# Patient Record
Sex: Male | Born: 1955 | Race: Black or African American | Hispanic: No | Smoking: Never smoker
Health system: Southern US, Community
[De-identification: ages and names within clinical notes are randomized; demographics above are authoritative.]

## PROBLEM LIST (undated history)

## (undated) DIAGNOSIS — M48061 Spinal stenosis, lumbar region without neurogenic claudication: Secondary | ICD-10-CM

## (undated) DIAGNOSIS — E785 Hyperlipidemia, unspecified: Secondary | ICD-10-CM

## (undated) DIAGNOSIS — D649 Anemia, unspecified: Secondary | ICD-10-CM

## (undated) DIAGNOSIS — K219 Gastro-esophageal reflux disease without esophagitis: Secondary | ICD-10-CM

## (undated) DIAGNOSIS — R5383 Other fatigue: Secondary | ICD-10-CM

## (undated) DIAGNOSIS — M5126 Other intervertebral disc displacement, lumbar region: Secondary | ICD-10-CM

## (undated) DIAGNOSIS — C61 Malignant neoplasm of prostate: Secondary | ICD-10-CM

## (undated) DIAGNOSIS — C189 Malignant neoplasm of colon, unspecified: Secondary | ICD-10-CM

## (undated) DIAGNOSIS — Z6841 Body Mass Index (BMI) 40.0 and over, adult: Secondary | ICD-10-CM

## (undated) HISTORY — PX: PROSTATE SURGERY: SHX751

## (undated) HISTORY — DX: Other fatigue: R53.83

## (undated) HISTORY — DX: Spinal stenosis, lumbar region without neurogenic claudication: M48.061

## (undated) HISTORY — DX: Body Mass Index (BMI) 40.0 and over, adult: Z684

## (undated) HISTORY — DX: Morbid (severe) obesity due to excess calories: E66.01

## (undated) HISTORY — DX: Other intervertebral disc displacement, lumbar region: M51.26

## (undated) HISTORY — PX: LAPAROSCOPY: SHX197

## (undated) HISTORY — DX: Malignant neoplasm of colon, unspecified: C18.9

## (undated) HISTORY — PX: COLON SURGERY: SHX602

---

## 1978-01-09 HISTORY — PX: KNEE ARTHROSCOPY: SUR90

## 2007-11-22 ENCOUNTER — Ambulatory Visit: Payer: Self-pay | Admitting: Internal Medicine

## 2007-11-22 DIAGNOSIS — J309 Allergic rhinitis, unspecified: Secondary | ICD-10-CM | POA: Insufficient documentation

## 2007-11-22 DIAGNOSIS — R9431 Abnormal electrocardiogram [ECG] [EKG]: Secondary | ICD-10-CM

## 2007-11-22 DIAGNOSIS — E785 Hyperlipidemia, unspecified: Secondary | ICD-10-CM

## 2007-11-22 DIAGNOSIS — Z8679 Personal history of other diseases of the circulatory system: Secondary | ICD-10-CM | POA: Insufficient documentation

## 2007-11-22 DIAGNOSIS — F329 Major depressive disorder, single episode, unspecified: Secondary | ICD-10-CM

## 2007-11-22 DIAGNOSIS — F3289 Other specified depressive episodes: Secondary | ICD-10-CM | POA: Insufficient documentation

## 2007-11-24 LAB — CONVERTED CEMR LAB
AST: 33 units/L (ref 0–37)
Basophils Absolute: 0 10*3/uL (ref 0.0–0.1)
Basophils Relative: 0.1 % (ref 0.0–3.0)
Chloride: 107 meq/L (ref 96–112)
Cholesterol: 212 mg/dL (ref 0–200)
Creatinine, Ser: 0.7 mg/dL (ref 0.4–1.5)
Direct LDL: 139.7 mg/dL
Eosinophils Absolute: 0.2 10*3/uL (ref 0.0–0.7)
GFR calc Af Amer: 152 mL/min
GFR calc non Af Amer: 126 mL/min
HDL: 44.2 mg/dL (ref >39.0)
Hemoglobin, Urine: NEGATIVE
Ketones, ur: NEGATIVE mg/dL
MCHC: 33.1 g/dL (ref 30.0–36.0)
MCV: 84.9 fL (ref 78.0–100.0)
Monocytes Absolute: 0.9 10*3/uL (ref 0.1–1.0)
Neutrophils Relative %: 54.5 % (ref 43.0–77.0)
PSA: 0.24 ng/mL (ref 0.10–4.00)
Platelets: 259 10*3/uL (ref 150–400)
RBC: 4.68 M/uL (ref 4.22–5.81)
TSH: 2.56 microintl units/mL (ref 0.35–5.50)
Total Bilirubin: 0.7 mg/dL (ref 0.3–1.2)
Triglycerides: 54 mg/dL (ref 0–149)
Urine Glucose: NEGATIVE mg/dL
Urobilinogen, UA: 0.2 (ref 0.0–1.0)
VLDL: 11 mg/dL (ref 0–40)

## 2007-12-04 ENCOUNTER — Ambulatory Visit: Payer: Self-pay

## 2007-12-04 ENCOUNTER — Encounter: Payer: Self-pay | Admitting: Internal Medicine

## 2008-03-15 ENCOUNTER — Telehealth: Payer: Self-pay | Admitting: Internal Medicine

## 2008-03-15 ENCOUNTER — Emergency Department (HOSPITAL_COMMUNITY): Admission: EM | Admit: 2008-03-15 | Discharge: 2008-03-15 | Payer: Self-pay | Admitting: Emergency Medicine

## 2008-03-15 ENCOUNTER — Encounter: Payer: Self-pay | Admitting: Gastroenterology

## 2008-03-17 ENCOUNTER — Ambulatory Visit: Payer: Self-pay | Admitting: Internal Medicine

## 2008-03-17 DIAGNOSIS — R10816 Epigastric abdominal tenderness: Secondary | ICD-10-CM

## 2008-03-17 DIAGNOSIS — R197 Diarrhea, unspecified: Secondary | ICD-10-CM

## 2008-03-17 DIAGNOSIS — K921 Melena: Secondary | ICD-10-CM | POA: Insufficient documentation

## 2008-03-17 DIAGNOSIS — R109 Unspecified abdominal pain: Secondary | ICD-10-CM

## 2008-03-17 LAB — CONVERTED CEMR LAB
Basophils Absolute: 0.1 10*3/uL (ref 0.0–0.1)
Basophils Relative: 0.6 % (ref 0.0–3.0)
Cholesterol: 148 mg/dL (ref 0–200)
Eosinophils Absolute: 0.2 10*3/uL (ref 0.0–0.7)
HCT: 38.6 % — ABNORMAL LOW (ref 39.0–52.0)
Hemoglobin: 13 g/dL (ref 13.0–17.0)
LDL Cholesterol: 88 mg/dL (ref 0–99)
MCHC: 33.5 g/dL (ref 30.0–36.0)
MCV: 84.9 fL (ref 78.0–100.0)
Monocytes Absolute: 1 10*3/uL (ref 0.1–1.0)
Neutro Abs: 5.9 10*3/uL (ref 1.4–7.7)
RBC: 4.55 M/uL (ref 4.22–5.81)
RDW: 13.6 % (ref 11.5–14.6)
Triglycerides: 76 mg/dL (ref 0–149)

## 2008-03-19 ENCOUNTER — Encounter (INDEPENDENT_AMBULATORY_CARE_PROVIDER_SITE_OTHER): Payer: Self-pay | Admitting: *Deleted

## 2008-03-19 ENCOUNTER — Encounter: Payer: Self-pay | Admitting: Internal Medicine

## 2008-05-13 ENCOUNTER — Ambulatory Visit: Payer: Self-pay | Admitting: Gastroenterology

## 2008-05-13 DIAGNOSIS — Z8500 Personal history of malignant neoplasm of unspecified digestive organ: Secondary | ICD-10-CM | POA: Insufficient documentation

## 2008-05-13 DIAGNOSIS — K625 Hemorrhage of anus and rectum: Secondary | ICD-10-CM

## 2008-05-13 DIAGNOSIS — Z85038 Personal history of other malignant neoplasm of large intestine: Secondary | ICD-10-CM | POA: Insufficient documentation

## 2008-05-19 ENCOUNTER — Ambulatory Visit: Payer: Self-pay | Admitting: Gastroenterology

## 2009-07-07 ENCOUNTER — Telehealth: Payer: Self-pay | Admitting: Internal Medicine

## 2009-08-03 ENCOUNTER — Telehealth: Payer: Self-pay | Admitting: Internal Medicine

## 2010-02-08 NOTE — Progress Notes (Signed)
  Phone Note Outgoing Call   Summary of Call: Called pt left. msg to call and schedule office visit with Dr. Jonny Ruiz as overdue for followup in order to continue getting refills on medication Initial call taken by: Robin Ewing CMA Duncan Dull),  August 03, 2009 10:58 AM

## 2010-02-08 NOTE — Progress Notes (Signed)
Summary: Rx refill req  Phone Note Refill Request Message from:  Patient on July 07, 2009 11:58 AM  Refills Requested: Medication #1:  LIPITOR 80 MG TABS 1 by mouth daily  Medication #2:  FLUOXETINE HCL 20 MG TABS 1 by mouth once daily Initial call taken by: Margaret Pyle, CMA,  July 07, 2009 11:58 AM    Prescriptions: FLUOXETINE HCL 20 MG TABS (FLUOXETINE HCL) 1 by mouth once daily  #30 x 0   Entered by:   Margaret Pyle, CMA   Authorized by:   Corwin Levins MD   Signed by:   Margaret Pyle, CMA on 07/07/2009   Method used:   Electronically to        [facility]  [address] [phone]* (retail)       [address]       [place], [place]  [id number]       Ph: [phone] or [phone]       Fax: [phone]   RxID:   [id number] LIPITOR 80 MG TABS (ATORVASTATIN CALCIUM) 1 by mouth daily  #30 x 0   Entered by:   Margaret Pyle, CMA   Authorized by:   Corwin Levins MD   Signed by:   Margaret Pyle, CMA on 07/07/2009   Method used:   [facility] to        [facility]  [address] [phone]* (retail)       [address]       [place], [place]  [id number]       Ph: [phone] or [phone]       Fax: [phone]   RxID:   [id number]

## 2010-04-21 LAB — CBC
MCHC: 33.6 g/dL (ref 30.0–36.0)
Platelets: 246 10*3/uL (ref 150–400)
RDW: 14.2 % (ref 11.5–15.5)

## 2010-04-21 LAB — COMPREHENSIVE METABOLIC PANEL
ALT: 41 U/L (ref 0–53)
Albumin: 3.5 g/dL (ref 3.5–5.2)
Alkaline Phosphatase: 99 U/L (ref 39–117)
Calcium: 9 mg/dL (ref 8.4–10.5)
Glucose, Bld: 104 mg/dL — ABNORMAL HIGH (ref 70–99)
Potassium: 3.9 mEq/L (ref 3.5–5.1)
Sodium: 139 mEq/L (ref 135–145)
Total Protein: 6.7 g/dL (ref 6.0–8.3)

## 2010-04-21 LAB — DIFFERENTIAL
Basophils Absolute: 0 10*3/uL (ref 0.0–0.1)
Eosinophils Absolute: 0.2 10*3/uL (ref 0.0–0.7)
Lymphs Abs: 2.2 10*3/uL (ref 0.7–4.0)
Monocytes Absolute: 1.1 10*3/uL — ABNORMAL HIGH (ref 0.1–1.0)
Monocytes Relative: 13 % — ABNORMAL HIGH (ref 3–12)
Neutro Abs: 4.8 10*3/uL (ref 1.7–7.7)
Neutrophils Relative %: 58 % (ref 43–77)

## 2010-04-21 LAB — LIPASE, BLOOD: Lipase: 64 U/L — ABNORMAL HIGH (ref 11–59)

## 2010-04-21 LAB — APTT: aPTT: 36 seconds (ref 24–37)

## 2010-04-21 LAB — HEMOCCULT GUIAC POC 1CARD (OFFICE): Fecal Occult Bld: POSITIVE

## 2013-03-02 ENCOUNTER — Emergency Department (INDEPENDENT_AMBULATORY_CARE_PROVIDER_SITE_OTHER)
Admission: EM | Admit: 2013-03-02 | Discharge: 2013-03-02 | Disposition: A | Discharge status: Home / Self Care | Payer: Worker's Compensation | Source: Home / Self Care | Attending: Family Medicine | Admitting: Family Medicine

## 2013-03-02 ENCOUNTER — Emergency Department (INDEPENDENT_AMBULATORY_CARE_PROVIDER_SITE_OTHER): Payer: Worker's Compensation

## 2013-03-02 ENCOUNTER — Encounter (HOSPITAL_COMMUNITY): Payer: Self-pay | Admitting: Emergency Medicine

## 2013-03-02 DIAGNOSIS — T1490XA Injury, unspecified, initial encounter: Secondary | ICD-10-CM

## 2013-03-02 DIAGNOSIS — W19XXXA Unspecified fall, initial encounter: Secondary | ICD-10-CM | POA: Diagnosis not present

## 2013-03-02 DIAGNOSIS — Y990 Civilian activity done for income or pay: Secondary | ICD-10-CM

## 2013-03-02 DIAGNOSIS — M25559 Pain in unspecified hip: Secondary | ICD-10-CM

## 2013-03-02 MED ORDER — TRAMADOL HCL 50 MG PO TABS: 50.0000 mg | ORAL_TABLET | Freq: Four times a day (QID) | ORAL | Status: DC | PRN

## 2013-03-02 NOTE — ED Provider Notes (Signed)
Justin Cain is a 58 y.o. male who presents to Urgent Care today for back and hip pain. Patient slipped and fell at work 2 days ago. He notes mild low back pain and bilateral anterior thigh and groin and hip pain. The pain is worse in the left where he predominantly landed. Pain is worse with activity. He denies any radiating pain weakness or numbness or bowel or bladder dysfunction. He has tried over-the-counter medications which have not helped much. He works as a Engineer, structural. No fevers or chills nausea vomiting or diarrhea.   History reviewed. No pertinent past medical history. History  Substance Use Topics  . Smoking status: Never Smoker   . Smokeless tobacco: Not on file  . Alcohol Use: No   ROS as above Medications: No current facility-administered medications for this encounter.   Current Outpatient Prescriptions  Medication Sig Dispense Refill  . traMADol (ULTRAM) 50 MG tablet Take 1 tablet (50 mg total) by mouth every 6 (six) hours as needed.  15 tablet  0    Exam:  BP 149/80  Pulse 78  Temp(Src) 98.4 F (36.9 C) (Oral)  Resp 19  SpO2 100% Gen: Well NAD obese Exts: Brisk capillary refill, warm and well perfused.  Back: Nontender to spinal midline.   Tender palpation bilateral SI joints. Normal back range of motion. Bilateral hips are nontender with decreased  rotation range of motion Strength is intact. Bilateral knees are nontender normal motion is stable ligamentous exam. Gait is antalgic. Sensation and capillary refill are intact distally  No results found for this or any previous visit (from the past 24 hour(s)). Dg Hip Bilateral W/pelvis  03/02/2013   CLINICAL DATA:  Fall.  Bilateral hip pain.  EXAM: BILATERAL HIP WITH PELVIS - 4+ VIEW  COMPARISON:  CT 03/15/2008  FINDINGS: Moderate to severe bilateral cystic degenerative joint disease involves both hips. Degenerative disc disease is noted within the lower lumbar spine. There is no acute fracture or subluxation  identified. No radio-opaque foreign body or soft tissue calcification.  IMPRESSION: 1. No acute findings. 2. 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   Electronically Signed   By: Kerby Moors M.D.   On: 03/02/2013 11:30    Assessment and Plan: 58 y.o. male with  fall exacerbating underlying lumbar and bilateral hip DJD/DDD.  Plan to treat with tramadol and relative rest. Work note provided. Followup with workers compensation preferred occupational physician.  Discussed warning signs or symptoms. Please see discharge instructions. Patient expresses understanding.    Gregor Hams, MD 03/02/13 [phone]

## 2013-03-02 NOTE — Discharge Instructions (Signed)
Thank you for coming in today. Use tramadol as needed for severe pain. Use Tylenol for regular pain. Take it easy for a few days. Followup with the occupational physician recommended by your workers compensation insurance.

## 2013-03-02 NOTE — ED Notes (Signed)
Reports slipping and falling on ice Friday morning.  Pt is c/o lower back, hip, and bilateral knee pain.  No relief with otc meds.

## 2013-03-08 ENCOUNTER — Encounter (HOSPITAL_COMMUNITY): Payer: Self-pay | Admitting: Emergency Medicine

## 2013-03-08 ENCOUNTER — Emergency Department (INDEPENDENT_AMBULATORY_CARE_PROVIDER_SITE_OTHER)
Admission: EM | Admit: 2013-03-08 | Discharge: 2013-03-08 | Disposition: A | Discharge status: Home / Self Care | Payer: Worker's Compensation | Source: Home / Self Care | Attending: Emergency Medicine | Admitting: Emergency Medicine

## 2013-03-08 DIAGNOSIS — M5431 Sciatica, right side: Secondary | ICD-10-CM

## 2013-03-08 DIAGNOSIS — M5432 Sciatica, left side: Principal | ICD-10-CM

## 2013-03-08 DIAGNOSIS — M5430 Sciatica, unspecified side: Secondary | ICD-10-CM

## 2013-03-08 HISTORY — DX: Gastro-esophageal reflux disease without esophagitis: K21.9

## 2013-03-08 HISTORY — DX: Hyperlipidemia, unspecified: E78.5

## 2013-03-08 MED ORDER — HYDROCODONE-ACETAMINOPHEN 5-325 MG PO TABS: 1.0000 | ORAL_TABLET | ORAL | Status: DC | PRN

## 2013-03-08 MED ORDER — PREDNISONE 20 MG PO TABS: 40.0000 mg | ORAL_TABLET | Freq: Every day | ORAL | Status: DC

## 2013-03-08 MED ORDER — PREDNISONE 20 MG PO TABS
ORAL_TABLET | ORAL | Status: AC
  Filled 2013-03-08: qty 3

## 2013-03-08 MED ORDER — PREDNISONE 20 MG PO TABS
60.0000 mg | ORAL_TABLET | Freq: Once | ORAL | Status: AC
  Administered 2013-03-08: 60 mg via ORAL

## 2013-03-08 MED ORDER — CYCLOBENZAPRINE HCL 10 MG PO TABS: 10.0000 mg | ORAL_TABLET | Freq: Two times a day (BID) | ORAL | Status: DC | PRN

## 2013-03-08 NOTE — Discharge Instructions (Signed)
Follow up with Dr Rhona Raider as soon as can be arranged.  Sciatica Sciatica is pain, weakness, numbness, or tingling along your sciatic nerve. The nerve starts in the lower back and runs down the back of each leg. Nerve damage or certain conditions pinch or put pressure on the sciatic nerve. This causes the pain, weakness, and other discomforts of sciatica. HOME CARE   Only take medicine as told by your doctor.  Apply ice to the affected area for 20 minutes. Do this 3 4 times a day for the first 48 72 hours. Then try heat in the same way.  Exercise, stretch, or do your usual activities if these do not make your pain worse.  Go to physical therapy as told by your doctor.  Keep all doctor visits as told.  Do not wear high heels or shoes that are not supportive.  Get a firm mattress if your mattress is too soft to lessen pain and discomfort. GET HELP RIGHT AWAY IF:   You cannot control when you poop (bowel movement) or pee (urinate).  You have more weakness in your lower back, lower belly (pelvis), butt (buttocks), or legs.  You have redness or puffiness (swelling) of your back.  You have a burning feeling when you pee.  You have pain that gets worse when you lie down.  You have pain that wakes you from your sleep.  Your pain is worse than past pain.  Your pain lasts longer than 4 weeks.  You are suddenly losing weight without reason. MAKE SURE YOU:   Understand these instructions.  Will watch this condition.  Will get help right away if you are not doing well or get worse. Document Released: 10/05/2007 Document Revised: 06/27/2011 Document Reviewed: 05/07/2011 [facility] Patient Information 2014 [place].

## 2013-03-08 NOTE — ED Notes (Signed)
Pt slipped on some ice at work a week ago and fell.  He has had pain in both hips, thighs and knees  since them.  He was seen here 2 days after the fall and returns because he is still having pain and needs another work note.   He ambulates with a limp.

## 2013-03-11 NOTE — ED Provider Notes (Signed)
CSN: [id number]     Arrival date & time 03/08/13  0920 History   First MD Initiated Contact with Patient 03/08/13 1100     Chief Complaint  Patient presents with  . Fall    Patient is a 58 y.o. male presenting with back pain. The history is provided by the patient.  Back Pain Location:  Lumbar spine Quality:  Aching and shooting Pain severity:  Severe Pain is:  Same all the time Duration:  3 days Timing:  Constant Progression:  Worsening Chronicity:  New Context: falling   Context: not emotional stress, not jumping from heights, not lifting heavy objects, not MCA, not MVA, not pedestrian accident, not physical stress, not recent illness, not recent injury and not twisting   Relieved by:  Nothing Worsened by:  Ambulation, bending and movement Associated symptoms: leg pain   Associated symptoms: no bladder incontinence, no bowel incontinence, no dysuria, no fever, no numbness, no paresthesias, no perianal numbness, no tingling, no weakness and no weight loss   Risk factors: obesity   Pt reports he fell on 02/28/2013 on ice while 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 He was seen here on 03/02/2013 for his LBP. He was given meds and a work note. The LBP has worsened and he has been unable to return to work. The pain now radiates into both thighs down to his bil knees and the meds no longer help. Pt needs a note to allow him to remain out of work until his back pain improves.   Past Medical History  Diagnosis Date  . GERD (gastroesophageal reflux disease)   . Hyperlipidemia   . Obesity    Past Surgical History  Procedure Laterality Date  . Knee arthroscopy    . Colon surgery    . Prostate surgery     No family history on file. History  Substance Use Topics  . Smoking status: Never Smoker   . Smokeless tobacco: Not on file  . Alcohol Use: Yes     Comment: occasional    Review of Systems  Constitutional: Positive for activity change. Negative for  fever and weight loss.  HENT: Negative.   Eyes: Negative.   Respiratory: Negative.   Cardiovascular: Negative.   Gastrointestinal: Negative.  Negative for bowel incontinence.  Endocrine: Negative.   Genitourinary: Negative.  Negative for bladder incontinence and dysuria.  Musculoskeletal: Positive for back pain.  Skin: Negative.   Allergic/Immunologic: Negative.   Neurological: Negative.  Negative for tingling, weakness, numbness and paresthesias.  Hematological: Negative.   Psychiatric/Behavioral: Negative.     Allergies  Penicillins  Home Medications   Current Outpatient Rx  Name  Route  Sig  Dispense  Refill  . atorvastatin (LIPITOR) 10 MG tablet   Oral   Take 10 mg by mouth daily.         Marland Kitchen esomeprazole (NEXIUM) 40 MG capsule   Oral   Take 40 mg by mouth daily at 12 noon.         . naproxen sodium (ANAPROX) 220 MG tablet   Oral   Take 440 mg by mouth 2 (two) times daily with a meal.         . ranitidine (ZANTAC) 150 MG tablet   Oral   Take 150 mg by mouth 2 (two) times daily.         . traMADol (ULTRAM) 50 MG tablet   Oral   Take 1 tablet (50 mg total) by  mouth every 6 (six) hours as needed.   15 tablet   0   . cyclobenzaprine (FLEXERIL) 10 MG tablet   Oral   Take 1 tablet (10 mg total) by mouth 2 (two) times daily as needed for muscle spasms.   15 tablet   0   . HYDROcodone-acetaminophen (NORCO/VICODIN) 5-325 MG per tablet   Oral   Take 1 tablet by mouth every 4 (four) hours as needed.   10 tablet   0   . predniSONE (DELTASONE) 20 MG tablet   Oral   Take 2 tablets (40 mg total) by mouth daily with breakfast.   10 tablet   0    BP 158/75  Pulse 66  Temp(Src) 97.9 F (36.6 C) (Oral)  Resp 17  SpO2 99% Physical Exam  Constitutional: He is oriented to person, place, and time. He appears well-developed and well-nourished.  Obese  HENT:  Head: Normocephalic and atraumatic.  Eyes: Conjunctivae are normal.  Cardiovascular: Normal rate.    Pulmonary/Chest: Effort normal.  Musculoskeletal:  Walks w/ a limp  Neurological: He is alert and oriented to person, place, and time.  Skin: Skin is warm and dry.  Psychiatric: He has a normal mood and affect.    ED Course  Procedures (including critical care time) Labs Review Labs Reviewed - No data to display Imaging Review No results found.   MDM   1. Bilateral sciatica    Pt presents w/ persistent and worsening LBP that has progressed to now w/ radiation into bil thighs to bil knees. Pt requesting something to help the pain and a work note to allow him to remain out of work until his pani is better. Pt has been unable to return to work d/t his worsening pain. Will treat pt for bil sciatica w/ Prednisone, Flexeril and a short course of medication for pain. Explained that we could not place him out of work indefinitely. Explained he will need to arrange f/u w/ the worker's Compensation MD associated w/ his job or the orthopedic referral provided to have further evaluation of his injuries and that MD would have to decide how long pt needs to be out of work. Orth referral provided. Pt verbalizes understanding and is agreeable w/ plan.    Jeryl Columbia, NP 03/11/13 [phone]

## 2013-03-13 NOTE — ED Provider Notes (Signed)
Medical screening examination/treatment/procedure(s) were performed by non-physician practitioner and as supervising physician I was immediately available for consultation/collaboration.  Philipp Deputy, M.D.   Harden Mo, MD 03/13/13 [phone]

## 2013-03-31 ENCOUNTER — Encounter: Payer: Self-pay | Admitting: *Deleted

## 2013-04-14 ENCOUNTER — Encounter: Payer: Self-pay | Admitting: Gastroenterology

## 2013-09-18 ENCOUNTER — Encounter: Payer: Self-pay | Admitting: Gastroenterology

## 2013-11-03 ENCOUNTER — Other Ambulatory Visit: Payer: Self-pay | Admitting: Specialist

## 2013-11-03 DIAGNOSIS — M48061 Spinal stenosis, lumbar region without neurogenic claudication: Secondary | ICD-10-CM

## 2014-03-13 ENCOUNTER — Encounter: Payer: Self-pay | Admitting: Gastroenterology

## 2014-09-03 HISTORY — PX: OTHER SURGICAL HISTORY: SHX169

## 2014-09-04 LAB — BASIC METABOLIC PANEL
BUN: 19 mg/dL (ref 4–21)
Creatinine: 0.8 mg/dL (ref 0.6–1.3)
GLUCOSE: 136 mg/dL
Potassium: 4.5 mmol/L (ref 3.4–5.3)
Sodium: 136 mmol/L — AB (ref 137–147)

## 2014-09-07 LAB — CBC AND DIFFERENTIAL
HEMATOCRIT: 38 % — AB (ref 41–53)
Hemoglobin: 12.3 g/dL — AB (ref 13.5–17.5)
PLATELETS: 260 10*3/uL (ref 150–399)
WBC: 7.9 10^3/mL

## 2014-09-07 LAB — HEPATIC FUNCTION PANEL
ALK PHOS: 69 U/L (ref 25–125)
ALT: 100 U/L — AB (ref 10–40)
AST: 95 U/L — AB (ref 14–40)
BILIRUBIN, TOTAL: 1 mg/dL

## 2014-09-07 LAB — BASIC METABOLIC PANEL
BUN: 10 mg/dL (ref 4–21)
Creatinine: 0.7 mg/dL (ref 0.6–1.3)
Glucose: 137 mg/dL
Potassium: 4.1 mmol/L (ref 3.4–5.3)
Sodium: 138 mmol/L (ref 137–147)

## 2014-12-02 ENCOUNTER — Emergency Department (HOSPITAL_COMMUNITY): Payer: Self-pay

## 2014-12-02 ENCOUNTER — Emergency Department (HOSPITAL_COMMUNITY)
Admission: EM | Admit: 2014-12-02 | Discharge: 2014-12-02 | Disposition: A | Discharge status: Home / Self Care | Payer: Self-pay | Attending: Emergency Medicine | Admitting: Emergency Medicine

## 2014-12-02 ENCOUNTER — Encounter (HOSPITAL_COMMUNITY): Payer: Self-pay | Admitting: Emergency Medicine

## 2014-12-02 DIAGNOSIS — Z862 Personal history of diseases of the blood and blood-forming organs and certain disorders involving the immune mechanism: Secondary | ICD-10-CM | POA: Insufficient documentation

## 2014-12-02 DIAGNOSIS — G8929 Other chronic pain: Secondary | ICD-10-CM | POA: Insufficient documentation

## 2014-12-02 DIAGNOSIS — Z8546 Personal history of malignant neoplasm of prostate: Secondary | ICD-10-CM | POA: Insufficient documentation

## 2014-12-02 DIAGNOSIS — Z8719 Personal history of other diseases of the digestive system: Secondary | ICD-10-CM | POA: Insufficient documentation

## 2014-12-02 DIAGNOSIS — Z79899 Other long term (current) drug therapy: Secondary | ICD-10-CM | POA: Insufficient documentation

## 2014-12-02 DIAGNOSIS — S0990XA Unspecified injury of head, initial encounter: Secondary | ICD-10-CM | POA: Insufficient documentation

## 2014-12-02 DIAGNOSIS — Z880 Allergy status to penicillin: Secondary | ICD-10-CM | POA: Insufficient documentation

## 2014-12-02 DIAGNOSIS — S0081XA Abrasion of other part of head, initial encounter: Secondary | ICD-10-CM | POA: Insufficient documentation

## 2014-12-02 DIAGNOSIS — E669 Obesity, unspecified: Secondary | ICD-10-CM | POA: Insufficient documentation

## 2014-12-02 DIAGNOSIS — Y9301 Activity, walking, marching and hiking: Secondary | ICD-10-CM | POA: Insufficient documentation

## 2014-12-02 DIAGNOSIS — S199XXA Unspecified injury of neck, initial encounter: Secondary | ICD-10-CM | POA: Insufficient documentation

## 2014-12-02 DIAGNOSIS — Y9289 Other specified places as the place of occurrence of the external cause: Secondary | ICD-10-CM | POA: Insufficient documentation

## 2014-12-02 DIAGNOSIS — M542 Cervicalgia: Secondary | ICD-10-CM

## 2014-12-02 DIAGNOSIS — Z85038 Personal history of other malignant neoplasm of large intestine: Secondary | ICD-10-CM | POA: Insufficient documentation

## 2014-12-02 DIAGNOSIS — Y998 Other external cause status: Secondary | ICD-10-CM | POA: Insufficient documentation

## 2014-12-02 DIAGNOSIS — W108XXA Fall (on) (from) other stairs and steps, initial encounter: Secondary | ICD-10-CM | POA: Insufficient documentation

## 2014-12-02 HISTORY — DX: Anemia, unspecified: D64.9

## 2014-12-02 HISTORY — DX: Malignant neoplasm of prostate: C61

## 2014-12-02 LAB — CBG MONITORING, ED: GLUCOSE-CAPILLARY: 70 mg/dL (ref 65–99)

## 2014-12-02 MED ORDER — ONDANSETRON HCL 4 MG/2ML IJ SOLN
4.0000 mg | Freq: Once | INTRAMUSCULAR | Status: AC
  Administered 2014-12-02: 4 mg via INTRAVENOUS
  Filled 2014-12-02: qty 2

## 2014-12-02 MED ORDER — TRAMADOL HCL 50 MG PO TABS: 50.0000 mg | ORAL_TABLET | Freq: Four times a day (QID) | ORAL | Status: DC | PRN

## 2014-12-02 MED ORDER — METHOCARBAMOL 500 MG PO TABS: 500.0000 mg | ORAL_TABLET | Freq: Two times a day (BID) | ORAL | Status: DC

## 2014-12-02 MED ORDER — FENTANYL CITRATE (PF) 100 MCG/2ML IJ SOLN
25.0000 ug | Freq: Once | INTRAMUSCULAR | Status: AC
  Administered 2014-12-02: 25 ug via INTRAVENOUS
  Filled 2014-12-02: qty 2

## 2014-12-02 NOTE — Discharge Instructions (Signed)
Your neck x-ray shows no sign of fracture or injury. As we discussed, the rest of your exam was reassuring. You may feel more sore over the next couple of days before you get better. I will give you a short course of tramadol and robaxin (muscle relaxer) to help with your pain. Do not drive and do not combine the medications with alcohol as they can make you drowsy. Please call your primary care provider within one week for follow-up. Return to the ED for new or concerning symptoms.    Please obtain all of your results from medical records or have your doctors office obtain the results - share them with your doctor - you should be seen at your doctors office in the next 2 days. Call today to arrange your follow up. Take the medications as prescribed. Please review all of the medicines and only take them if you do not have an allergy to them. Please be aware that if you are taking birth control pills, taking other prescriptions, ESPECIALLY ANTIBIOTICS may make the birth control ineffective - if this is the case, either do not engage in sexual activity or use alternative methods of birth control such as condoms until you have finished the medicine and your family doctor says it is OK to restart them. If you are on a blood thinner such as COUMADIN, be aware that any other medicine that you take may cause the coumadin to either work too much, or not enough - you should have your coumadin level rechecked in next 7 days if this is the case.  ?  It is also a possibility that you have an allergic reaction to any of the medicines that you have been prescribed - Everybody reacts differently to medications and while MOST people have no trouble with most medicines, you may have a reaction such as nausea, vomiting, rash, swelling, shortness of breath. If this is the case, please stop taking the medicine immediately and contact your physician.  ?  You should return to the ER if you develop severe or worsening symptoms.

## 2014-12-02 NOTE — ED Notes (Signed)
Bed: WA17 Expected date:  Expected time:  Means of arrival:  Comments: EMS- 59yo M, fell down stairs/head injury

## 2014-12-02 NOTE — ED Provider Notes (Signed)
CSN: [id number]     Arrival date & time 12/02/14  1703 History   First MD Initiated Contact with Patient 12/02/14 1739     Chief Complaint  Patient presents with  . Fall   HPI  Justin Cain is an 59 y.o. male with history of chronic back and hip pain, obesity, GERD, HLD, colon and prostate cancer who presents to the ED for evaluation after a fall. He states he was walking down the stairs when his cane slipped from under him and he fell down the last couple of steps face first. States he did not tumble. States that he landed on his side. He has an abrasion to his L eyebrow but denies visual disturbances. States he has a diffuse headache and states that his body aches all over. He does endorse chronic back pain and hip pain and is in the process of losing weight (s/p bariatric sleeve) for ortho surgery. However he does note some neck soreness increased from baseline. Denies any numbness or tingling. He states that his legs are chronically weak from a work injury 2 years ago but he notes nothing increased from baseline. Denies dizziness or feeling lightheaded. Denies chest pain or difficulty breathing.   Past Medical History  Diagnosis Date  . GERD (gastroesophageal reflux disease)   . Hyperlipidemia   . Obesity   . Colon cancer ([place])   . Anemia   . Prostate cancer [facility])    Past Surgical History  Procedure Laterality Date  . Knee arthroscopy    . Colon surgery    . Prostate surgery     History reviewed. No pertinent family history. Social History  Substance Use Topics  . Smoking status: Never Smoker   . Smokeless tobacco: Never Used  . Alcohol Use: Yes     Comment: occasional    Review of Systems  All other systems reviewed and are negative.     Allergies  Penicillins  Home Medications   Prior to Admission medications   Medication Sig Start Date End Date Taking? Authorizing Provider  docusate sodium (COLACE) 100 MG capsule Take 100 mg by mouth daily as needed for mild  constipation or moderate constipation.   Yes Historical Provider, MD  naproxen sodium (ANAPROX) 220 MG tablet Take 440 mg by mouth 2 (two) times daily as needed (pain).   Yes Historical Provider, MD  ranitidine (ZANTAC) 150 MG tablet Take 150 mg by mouth 2 (two) times daily.   Yes Historical Provider, MD  cyclobenzaprine (FLEXERIL) 10 MG tablet Take 1 tablet (10 mg total) by mouth 2 (two) times daily as needed for muscle spasms. Patient not taking: Reported on 12/02/2014 03/08/13   Rhetta Mura Schorr, NP  HYDROcodone-acetaminophen (NORCO/VICODIN) 5-325 MG per tablet Take 1 tablet by mouth every 4 (four) hours as needed. Patient not taking: Reported on 12/02/2014 03/08/13   Rhetta Mura Schorr, NP  predniSONE (DELTASONE) 20 MG tablet Take 2 tablets (40 mg total) by mouth daily with breakfast. Patient not taking: Reported on 12/02/2014 03/08/13   Rhetta Mura Schorr, NP  traMADol (ULTRAM) 50 MG tablet Take 1 tablet (50 mg total) by mouth every 6 (six) hours as needed. Patient not taking: Reported on 12/02/2014 03/02/13   Gregor Hams, MD   BP 151/89 mmHg  Pulse 61  Temp(Src) 97.6 F (36.4 C) (Oral)  Resp 17  SpO2 97% Physical Exam  Constitutional: He is oriented to person, place, and time.  Obese, NAD  HENT:  Right Ear: External ear  normal.  Left Ear: External ear normal.  Nose: Nose normal.  Mouth/Throat: Oropharynx is clear and moist. No oropharyngeal exudate.  L eyebrow with smalls uperficial abrasion. Not actively bleeding. Mildly edematous. No crepitus. No instability. EOM intact and not painful  Eyes: Conjunctivae and EOM are normal. Pupils are equal, round, and reactive to light.  Neck: Normal range of motion. Neck supple.  Mild c-spine tenderness. Tenderness to paraspinal muscles. FROM  Cardiovascular: Normal rate, regular rhythm, normal heart sounds and intact distal pulses.   No murmur heard. Pulmonary/Chest: Effort normal and breath sounds normal. No respiratory distress. He has no  wheezes.  Abdominal: Soft. Bowel sounds are normal. He exhibits no distension. There is no tenderness.  Musculoskeletal: Normal range of motion. He exhibits no edema.  Neurological: He is alert and oriented to person, place, and time. He has normal strength. No cranial nerve deficit or sensory deficit.  Skin: Skin is warm and dry.  Psychiatric: He has a normal mood and affect.  Nursing note and vitals reviewed.   ED Course  Procedures (including critical care time) Labs Review Labs Reviewed  CBG MONITORING, ED    Imaging Review Dg Cervical Spine Complete  12/02/2014  CLINICAL DATA:  Status post fall. No loss of consciousness or reported neck pain. Chronic back pain. Initial encounter. EXAM: CERVICAL SPINE - COMPLETE 4+ VIEW COMPARISON:  None. FINDINGS: The prevertebral soft tissues appear normal. The alignment is anatomic through T1. The lower cervical spine is not well visualized in the lateral projection due to body habitus. There is no evidence of acute fracture or traumatic subluxation. There is mild facet hypertrophy and uncinate spurring. The posterior arch of C1 appears incomplete. Ossification of the ligamentum nuchae noted. IMPRESSION: No evidence of acute cervical spine fracture, traumatic subluxation or static signs of instability. Mild spondylosis. Electronically Signed   By: Richardean Sale M.D.   On: 12/02/2014 19:34   I have personally reviewed and evaluated these images and lab results as part of my medical decision-making.   EKG Interpretation None      MDM   Final diagnoses:  Neck pain  Abrasion of face, initial encounter    Given mechanism of fall, pt's age, and pt preference will get c-spine XR to r/o fracture though i have low suspicion for fx as pt is only mildly tender, has FROM. Will give pain medicine here. PT requesting rx for pain meds at home but given documented history of substance abuse  i discussed that i can give short course of robaxin and tramadol  but will not be giving him vicodin or percocet. Encouraged using ice and NSAIDs for abrasion. Pt in agreement.  XR negative. Pt reports pain relief. Will d/c home with return precautions. Pt in agreement.    Anne Ng, PA-C 12/03/14 [place], MD 12/03/14 [phone]

## 2014-12-02 NOTE — ED Notes (Signed)
[facility] presents with 59 yo male with a witnessed fall from stairs.  Pts friend wsaw pt using cane coming down stairs when cane slipped out from his hand and pt fell on face at base of stairs.  No LOC, chronic back pain from other issues but not more than normal pain (per patient), No neck pain.  Pt has small abrasion on left eyebrow with head pain in that area.  VS stable.  Pt alert and oriented x4.

## 2015-06-08 ENCOUNTER — Encounter: Payer: Self-pay | Admitting: Adult Health

## 2015-06-08 ENCOUNTER — Non-Acute Institutional Stay (SKILLED_NURSING_FACILITY): Payer: Self-pay | Admitting: Adult Health

## 2015-06-08 DIAGNOSIS — M1611 Unilateral primary osteoarthritis, right hip: Secondary | ICD-10-CM

## 2015-06-08 DIAGNOSIS — K2100 Gastro-esophageal reflux disease with esophagitis, without bleeding: Secondary | ICD-10-CM

## 2015-06-08 DIAGNOSIS — R509 Fever, unspecified: Secondary | ICD-10-CM

## 2015-06-08 DIAGNOSIS — R112 Nausea with vomiting, unspecified: Secondary | ICD-10-CM

## 2015-06-08 DIAGNOSIS — K5901 Slow transit constipation: Secondary | ICD-10-CM

## 2015-06-08 DIAGNOSIS — D62 Acute posthemorrhagic anemia: Secondary | ICD-10-CM

## 2015-06-08 NOTE — Progress Notes (Signed)
Patient ID: Justin Cain, male   DOB: 06/08/1955, 60 y.o.   MRN: [id number]    DATE:  06/08/2015   MRN:  [id number]  BIRTHDAY: 17-Aug-1955  Facility:  Nursing Home Location:  [place] Room Number: 807-P  LEVEL OF CARE:  SNF (31)  Contact Information    Name Relation Home Work Mobile   [place] Brother [phone]         Code Status History    This patient does not have a recorded code status. Please follow your organizational policy for patients in this situation.       Chief Complaint  Patient presents with  . Hospitalization Follow-up    HISTORY OF PRESENT ILLNESS:  This is a 60 year old male who has been admitted to [facility] on 06/04/15 from [place]. He has right hip osteoarthritis for which she had right total hip arthroplasty on Jun 01, 2015.   He has low-grade fever today, F 100.0  . He verbalized that his pain is well-controlled.  He has been admitted for a short-term rehabilitation.  PAST MEDICAL HISTORY:  Past Medical History  Diagnosis Date  . GERD (gastroesophageal reflux disease)   . Hyperlipidemia   . Morbid obesity ([place])   . Colon cancer ([place])   . Anemia   . Prostate cancer ([place])   . Displacement of lumbar intervertebral disc without myelopathy   . Spinal stenosis of lumbar region   . Fatigue   . Body mass index 40.0-44.9, adult (HCC)      CURRENT MEDICATIONS: Reviewed  Patient's Medications  New Prescriptions   No medications on file  Previous Medications   ACETAMINOPHEN (TYLENOL) 325 MG TABLET    Take by mouth every 6 (six) hours as needed.   ENOXAPARIN (LOVENOX) 40 MG/0.4ML INJECTION    Inject 40 mg into the skin daily.   METHOCARBAMOL (ROBAXIN) 500 MG TABLET    Take 1 tablet (500 mg total) by mouth 2 (two) times daily.   ONDANSETRON (ZOFRAN) 4 MG TABLET    Take 4 mg by mouth. Take 4 mg every 4-6h prn for nausea   OXYCODONE (OXY-IR) 5 MG CAPSULE    Take 5 mg by mouth every 6  (six) hours.   PROMETHAZINE (PHENERGAN) 6.25 MG/5ML SYRUP    Take 12.5 mg by mouth every 6 (six) hours as needed for nausea or vomiting.   TRAMADOL (ULTRAM) 50 MG TABLET    Take 50 mg by mouth every 6 (six) hours as needed.  Modified Medications   No medications on file  Discontinued Medications   CYCLOBENZAPRINE (FLEXERIL) 10 MG TABLET    Take 1 tablet (10 mg total) by mouth 2 (two) times daily as needed for muscle spasms.   DOCUSATE SODIUM (COLACE) 100 MG CAPSULE    Take 100 mg by mouth daily as needed for mild constipation or moderate constipation.   HYDROCODONE-ACETAMINOPHEN (NORCO/VICODIN) 5-325 MG PER TABLET    Take 1 tablet by mouth every 4 (four) hours as needed.   NAPROXEN SODIUM (ANAPROX) 220 MG TABLET    Take 440 mg by mouth 2 (two) times daily as needed (pain).   PREDNISONE (DELTASONE) 20 MG TABLET    Take 2 tablets (40 mg total) by mouth daily with breakfast.   RANITIDINE (ZANTAC) 150 MG TABLET    Take 150 mg by mouth 2 (two) times daily.   TRAMADOL (ULTRAM) 50 MG TABLET    Take 1 tablet (50 mg total) by mouth  every 6 (six) hours as needed.   TRAMADOL (ULTRAM) 50 MG TABLET    Take 1 tablet (50 mg total) by mouth every 6 (six) hours as needed.     Allergies  Allergen Reactions  . Penicillins     Has patient had a PCN reaction causing immediate rash, facial/tongue/throat swelling, SOB or lightheadedness with hypotension: No Has patient had a PCN reaction causing severe rash involving mucus membranes or skin necrosis: No Has patient had a PCN reaction that required hospitalization No Has patient had a PCN reaction occurring within the last 10 years: No If all of the above answers are "NO", then may proceed with Cephalosporin use.      REVIEW OF SYSTEMS:  GENERAL: no change in appetite, no fatigue, no weight changes, no chills or weakness, +fever EYES: Denies change in vision, dry eyes, eye pain, itching or discharge EARS: Denies change in hearing, ringing in ears, or  earache NOSE: Denies nasal congestion or epistaxis MOUTH and THROAT: Denies oral discomfort, gingival pain or bleeding, pain from teeth or hoarseness   RESPIRATORY: no cough, SOB, DOE, wheezing, hemoptysis CARDIAC: no chest pain, edema or palpitations GI: no abdominal pain, diarrhea, constipation, heart burn, nausea or vomiting GU: Denies dysuria, frequency, hematuria, incontinence, or discharge PSYCHIATRIC: Denies feeling of depression or anxiety. No report of hallucinations, insomnia, paranoia, or agitation   PHYSICAL EXAMINATION  GENERAL APPEARANCE: Well nourished. In no acute distress. Obese  SKIN:  Right hip surgical incision is covered with Aquacel dressing, dry, no erythema HEAD: Normal in size and contour. No evidence of trauma EYES: Lids open and close normally. No blepharitis, entropion or ectropion. PERRL. Conjunctivae are clear and sclerae are white. Lenses are without opacity EARS: Pinnae are normal. Patient hears normal voice tunes of the examiner MOUTH and THROAT: Lips are without lesions. Oral mucosa is moist and without lesions. Tongue is normal in shape, size, and color and without lesions NECK: supple, trachea midline, no neck masses, no thyroid tenderness, no thyromegaly LYMPHATICS: no LAN in the neck, no supraclavicular LAN RESPIRATORY: breathing is even & unlabored, BS CTAB CARDIAC: RRR, no murmur,no extra heart sounds, RLE trace edema  GI: abdomen soft, normal BS, no masses, no tenderness, no hepatomegaly, no splenomegaly EXTREMITIES:  Able to move 4 extremities PSYCHIATRIC: Alert and oriented X 3. Affect and behavior are appropriate  LABS/RADIOLOGY: Labs reviewed: 06/03/15   WBC 14.4 hemoglobin 11.1 hematocrit 34.0 06/02/15   sodium 140 potassium 4.9 glucose 135 BUN 16 creatinine 0.8 Basic Metabolic Panel:  Recent Labs  09/04/14 09/07/14  NA 136* 138  K 4.5 4.1  BUN 19 10  CREATININE 0.8 0.7   Liver Function Tests:  Recent Labs  09/07/14  AST 95*   ALT 100*  ALKPHOS 69   CBC:  Recent Labs  09/07/14  WBC 7.9  HGB 12.3*  HCT 38*  PLT 260    CBG:  Recent Labs  12/02/14 1818  GLUCAP 70       ASSESSMENT/PLAN:  Right hip osteoarthritis S/P  Right total hip arthroplasty - - for rehabilitation; continue oxycodone 5 mg 1 tab by mouth every 6 hours when necessary and tramadol 50 mg 1 tab by mouth every 6 hours when necessary for pain; Lovenox 40 mg subcutaneous daily for DVT prophylaxis; Robaxin 500 mg 1 tab by mouth twice a day for muscle spasm; follow-up with orthopedic surgeon in 2 weeks  Nausea - continue Zofran 4 mg 1 tab by mouth every 4-6 hours when necessary  and promethazine 6.25 mg/5 ML take 10 mL by mouth every 6 hours till 06/09/15; check CMP  Fever - start Tylenol 325 mg take 2 tabs = 650 mg by mouth every 6 hours when necessary for fever; check CBC  Anemia, acute blood loss - hemoglobin 11.1; check CBC  GERD - start Zantac 150 mg 1 tab PO Q D and omeprazole 20 mg 1 capsule PO Q AM ac  Constipation - start Colace 100 mg 1 capsule PO BID    Goals of care:  Short-term rehabilitation    Durenda Age, NP [place] [phone]

## 2015-06-09 ENCOUNTER — Non-Acute Institutional Stay (SKILLED_NURSING_FACILITY): Payer: Self-pay | Admitting: Internal Medicine

## 2015-06-09 ENCOUNTER — Encounter: Payer: Self-pay | Admitting: Internal Medicine

## 2015-06-09 DIAGNOSIS — M1611 Unilateral primary osteoarthritis, right hip: Secondary | ICD-10-CM

## 2015-06-09 DIAGNOSIS — R2681 Unsteadiness on feet: Secondary | ICD-10-CM

## 2015-06-09 DIAGNOSIS — R509 Fever, unspecified: Secondary | ICD-10-CM

## 2015-06-09 DIAGNOSIS — K5901 Slow transit constipation: Secondary | ICD-10-CM

## 2015-06-09 DIAGNOSIS — K219 Gastro-esophageal reflux disease without esophagitis: Secondary | ICD-10-CM

## 2015-06-09 DIAGNOSIS — D62 Acute posthemorrhagic anemia: Secondary | ICD-10-CM

## 2015-06-09 DIAGNOSIS — I959 Hypotension, unspecified: Secondary | ICD-10-CM

## 2015-06-09 LAB — CBC AND DIFFERENTIAL
HCT: 32 % — AB (ref 41–53)
HEMOGLOBIN: 10 g/dL — AB (ref 13.5–17.5)
Platelets: 430 10*3/uL — AB (ref 150–399)
WBC: 10.5 10^3/mL

## 2015-06-09 LAB — BASIC METABOLIC PANEL
BUN: 14 mg/dL (ref 4–21)
CREATININE: 0.8 mg/dL (ref 0.6–1.3)
GLUCOSE: 107 mg/dL
Potassium: 3.6 mmol/L (ref 3.4–5.3)
Sodium: 142 mmol/L (ref 137–147)

## 2015-06-09 NOTE — Progress Notes (Signed)
LOCATION: [place]  PCP: Cathlean Cower, MD   Code Status: Full Code  Goals of care: Advanced Directive information Advanced Directives 12/02/2014  Does patient have an advance directive? No  Would patient like information on creating an advanced directive? No - patient declined information       Extended Emergency Contact Information Primary Emergency Contact: Morgan,Sydney Address: [place], [place] [address] [place] Phone: [phone] Relation: Brother   Allergies  Allergen Reactions  . Penicillins     Has patient had a PCN reaction causing immediate rash, facial/tongue/throat swelling, SOB or lightheadedness with hypotension: No Has patient had a PCN reaction causing severe rash involving mucus membranes or skin necrosis: No Has patient had a PCN reaction that required hospitalization No Has patient had a PCN reaction occurring within the last 10 years: No If all of the above answers are "NO", then may proceed with Cephalosporin use.     Chief Complaint  Patient presents with  . New Admit To SNF    New Admission     HPI:  Patient is a 60 y.o. male seen today for short term rehabilitation post hospital admission from 06/01/15-06/04/15 with right hip OA. He underwent right total hip arthroplasty. He is seen in his room today. He has done therapy this am with our therapy team and complaints of being tired now. He would like his pain medication adjusted to help with pain control. Denies any other concerns. Per nursing, he had a temperature spike to 100 yesterday am. No further fever reported since then.   Review of Systems:  Constitutional: Negative for fever, chills, diaphoresis. Feels weak and tired. HENT: Negative for headache, congestion, nasal discharge, hearing loss, sore throat, difficulty swallowing.   Eyes: Negative for blurred vision, double vision and discharge. Wears glasses. Respiratory: Negative for cough,  shortness of breath and wheezing.   Cardiovascular: Negative for chest pain, palpitations. has leg swelling.  Gastrointestinal: Negative for heartburn, nausea, vomiting, abdominal pain, loss of appetite, melena, diarrhea and constipation. Last bowel movement was yesterday.  Genitourinary: Negative for dysuria and flank pain.  Musculoskeletal: Negative for back pain, fall in the facility.  Skin: Negative for itching, rash.  Neurological: Negative for dizziness. Psychiatric/Behavioral: Negative for depression   Past Medical History  Diagnosis Date  . GERD (gastroesophageal reflux disease)   . Hyperlipidemia   . Morbid obesity ([place])   . Colon cancer ([place])   . Anemia   . Prostate cancer ([place])   . Displacement of lumbar intervertebral disc without myelopathy   . Spinal stenosis of lumbar region   . Fatigue   . Body mass index 40.0-44.9, adult [facility])    Past Surgical History  Procedure Laterality Date  . Knee arthroscopy  1980  . Colon surgery    . Prostate surgery    . Laparoscopy    . 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 He has never used smokeless tobacco. He reports that he drinks alcohol. He reports that he does not use illicit drugs.  No family history on file.  Medications:   Medication List       This list is accurate as of: 06/09/15  2:08 PM.  Always use your most recent med list.               acetaminophen 325 MG tablet  Commonly known as:  TYLENOL  Take 650 mg by mouth every 6 (six) hours as needed.     docusate sodium 100 MG capsule  Commonly known as:  COLACE  Take 100 mg by mouth 2 (two) times daily.     enoxaparin 40 MG/0.4ML injection  Commonly known as:  LOVENOX  Inject 40 mg into the skin daily.     methocarbamol 500 MG tablet  Commonly known as:  ROBAXIN  Take 1 tablet (500 mg total) by mouth 2 (two) times daily.     omeprazole 20 MG capsule  Commonly known as:  PRILOSEC  Take 20 mg by  mouth every morning.     ondansetron 4 MG tablet  Commonly known as:  ZOFRAN  Take 4 mg by mouth. Take 4 mg every 4-6h prn for nausea     oxycodone 5 MG capsule  Commonly known as:  OXY-IR  Take 5 mg by mouth every 6 (six) hours.     ranitidine 150 MG tablet  Commonly known as:  ZANTAC  Take 150 mg by mouth every morning.     traMADol 50 MG tablet  Commonly known as:  ULTRAM  Take 50 mg by mouth every 6 (six) hours as needed.        Immunizations: Immunization History  Administered Date(s) Administered  . Influenza Whole 11/22/2007  . PPD Test 06/07/2015  . Td 11/22/2007     Physical Exam: Filed Vitals:   06/09/15 1352  BP: 98/53  Pulse: 62  Temp: 99.1 F (37.3 C)  TempSrc: Oral  Resp: 18  Height: 5\' 6"  (1.676 m)  Weight: 247 lb (112.038 kg)  SpO2: 98%   Body mass index is 39.89 kg/(m^2).  General- elderly obese male, in no acute distress Head- normocephalic, atraumatic Nose- no maxillary or frontal sinus tenderness, no nasal discharge Throat- moist mucus membrane  Eyes- PERRLA, EOMI, no pallor, no icterus, no discharge, normal conjunctiva, normal sclera Neck- no cervical lymphadenopathy Cardiovascular- normal s1,s2, no murmur, trace leg edema Respiratory- bilateral clear to auscultation, no wheeze, no rhonchi, no crackles, no use of accessory muscles Abdomen- bowel sounds present, soft, non tender Musculoskeletal- able to move all 4 extremities, limited right hip ROM Neurological- alert and oriented to person, place and time Skin- warm and dry, right hip surgical incision with aquacel dressing with drainage present, dressing removed, has clean surgical incision with staples in place Psychiatry- normal mood and affect    Labs reviewed: Basic Metabolic Panel:  Recent Labs  09/04/14 09/07/14  NA 136* 138  K 4.5 4.1  BUN 19 10  CREATININE 0.8 0.7   Liver Function Tests:  Recent Labs  09/07/14  AST 95*  ALT 100*  ALKPHOS 69   No results for  input(s): LIPASE, AMYLASE in the last 8760 hours. No results for input(s): AMMONIA in the last 8760 hours. CBC:  Recent Labs  09/07/14  WBC 7.9  HGB 12.3*  HCT 38*  PLT 260   Cardiac Enzymes: No results for input(s): CKTOTAL, CKMB, CKMBINDEX, TROPONINI in the last 8760 hours. BNP: Invalid input(s): POCBNP CBG:  Recent Labs  12/02/14 1818  GLUCAP 70     Assessment/Plan  Unsteady gait Post right hip surgery, Will have patient work with PT/OT as tolerated to regain strength and restore function.  Fall precautions are in place.  Right hip osteoarthritis  S/P  Right total hip arthroplasty. Has follow up with orthopedics. WBAT to RLE. Continue oxyIR but change this to 5 mg 1-2 tab q6h prn  pain. Discontinue hydrocodone-apap prn order. Continue tramadol 50 mg q6h prn pain. Continue robaxin 500 mg bid for muscle spasm. Continue lovenox for dvt prophylaxis. Will have him work with physical therapy and occupational therapy team to help with gait training and muscle strengthening exercises.fall precautions. Skin care. Encourage to be out of bed. Get physiatry consult  Fever Had low grade temperature spike yesterday. Surgical incision site appears good. Monitor wbc and temp curve  Constipation Continue colace 100 mg bid  Blood loss anemia Post op, monitor cbc  GERD Stable, continue zantac and prilosec  Low blood pressure reading Denies headache or dizziness. Check bp q shift x 5 days and monitor   Goals of care: short term rehabilitation   Labs/tests ordered: cbc, cmp  Family/ staff Communication: reviewed care plan with patient and nursing supervisor    Blanchie Serve, MD Internal Medicine [place], [place] [address] Cell Phone (Monday-Friday 8 am - 5 pm): [phone] On Call: [phone] and follow prompts after 5 pm and on weekends Office Phone: [phone] Office Fax: [phone]

## 2015-06-11 ENCOUNTER — Non-Acute Institutional Stay (SKILLED_NURSING_FACILITY): Payer: Self-pay | Admitting: Internal Medicine

## 2015-06-11 ENCOUNTER — Encounter: Payer: Self-pay | Admitting: Internal Medicine

## 2015-06-11 DIAGNOSIS — D62 Acute posthemorrhagic anemia: Secondary | ICD-10-CM

## 2015-06-11 NOTE — Progress Notes (Signed)
LOCATION: [facility]  PCP: Cathlean Cower, MD   Code Status: Full Code  Goals of care: Advanced Directive information Advanced Directives 12/02/2014  Does patient have an advance directive? No  Would patient like information on creating an advanced directive? No - patient declined information       Extended Emergency Contact Information Primary Emergency Contact: Morgan,Sydney Address: [place],  [address] [place] Phone: [phone] Relation: Brother   Allergies  Allergen Reactions  . Penicillins     Has patient had a PCN reaction causing immediate rash, facial/tongue/throat swelling, SOB or lightheadedness with hypotension: No Has patient had a PCN reaction causing severe rash involving mucus membranes or skin necrosis: No Has patient had a PCN reaction that required hospitalization No Has patient had a PCN reaction occurring within the last 10 years: No If all of the above answers are "NO", then may proceed with Cephalosporin use.     Chief Complaint  Patient presents with  . Acute Visit    Anemia     HPI:  Patient is a 60 y.o. male seen today for acute visit for drop in his hemoglobin level. He is here for short term rehabilitation post hospital admission from 06/01/15-06/04/15 with right hip OA status post right total hip arthroplasty.   Review of Systems:  Constitutional: Negative for fever. HENT: Negative for headache. Eyes: Negative for blurred vision. Respiratory: Negative for cough, shortness of breath and wheezing.   Cardiovascular: Negative for chest pain, palpitations. He has leg swelling.  Gastrointestinal: Negative for heartburn, nausea, vomiting, abdominal pain, melena. Genitourinary: Negative for dysuria and flank pain.  Musculoskeletal: Negative for back pain, fall in the facility.  Skin: Negative for itching, rash.  Neurological: Negative for dizziness. Psychiatric/Behavioral: Negative for  depression   Past Medical History  Diagnosis Date  . GERD (gastroesophageal reflux disease)   . Hyperlipidemia   . Morbid obesity ([place])   . Colon cancer ([place])   . Anemia   . Prostate cancer ([place])   . Displacement of lumbar intervertebral disc without myelopathy   . Spinal stenosis of lumbar region   . Fatigue   . Body mass index 40.0-44.9, adult [facility])    Past Surgical History  Procedure Laterality Date  . Knee arthroscopy  1980  . Colon surgery    . Prostate surgery    . Laparoscopy    . 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 He has never used smokeless tobacco. He reports that he drinks alcohol. He reports that he does not use illicit drugs.  No family history on file.  Medications:   Medication List       This list is accurate as of: 06/11/15  1:51 PM.  Always use your most recent med list.               acetaminophen 325 MG tablet  Commonly known as:  TYLENOL  Take 650 mg by mouth every 6 (six) hours as needed.     docusate sodium 100 MG capsule  Commonly known as:  COLACE  Take 100 mg by mouth 2 (two) times daily.     enoxaparin 40 MG/0.4ML injection  Commonly known as:  LOVENOX  Inject 40 mg into the skin daily.     methocarbamol 500 MG tablet  Commonly known as:  ROBAXIN  Take 1 tablet (500 mg total)  by mouth 2 (two) times daily.     omeprazole 20 MG capsule  Commonly known as:  PRILOSEC  Take 20 mg by mouth every morning.     ondansetron 4 MG tablet  Commonly known as:  ZOFRAN  Take 4 mg by mouth. Take 4 mg every 4-6h prn for nausea     oxycodone 5 MG capsule  Commonly known as:  OXY-IR  Take 5 mg by mouth every 6 (six) hours.     ranitidine 150 MG tablet  Commonly known as:  ZANTAC  Take 150 mg by mouth every morning.     traMADol 50 MG tablet  Commonly known as:  ULTRAM  Take 50 mg by mouth every 6 (six) hours as needed.        Immunizations: Immunization History  Administered  Date(s) Administered  . Influenza Whole 11/22/2007  . PPD Test 06/07/2015  . Td 11/22/2007     Physical Exam: Filed Vitals:   06/11/15 1345  BP: 101/57  Temp: 98.7 F (37.1 C)  TempSrc: Oral  Height: 5\' 6"  (1.676 m)  Weight: 248 lb 9.6 oz (112.764 kg)   Body mass index is 40.14 kg/(m^2).  General- elderly obese male, in no acute distress Head- normocephalic, atraumatic Nose- no maxillary or frontal sinus tenderness, no nasal discharge Throat- moist mucus membrane  Eyes- PERRLA, EOMI, no pallor, no icterus, no discharge, normal conjunctiva, normal sclera Neck- no cervical lymphadenopathy Cardiovascular- normal s1,s2, no murmur, trace leg edema Respiratory- bilateral clear to auscultation Abdomen- bowel sounds present, soft, non tender Musculoskeletal- able to move all 4 extremities, limited right hip ROM Neurological- alert and oriented to person, place and time Skin- warm and dry, right hip surgical incision with dressing in place Psychiatry- normal mood and affect    Labs reviewed: Basic Metabolic Panel:  Recent Labs  09/04/14 09/07/14  NA 136* 138  K 4.5 4.1  BUN 19 10  CREATININE 0.8 0.7   Liver Function Tests:  Recent Labs  09/07/14  AST 95*  ALT 100*  ALKPHOS 69   No results for input(s): LIPASE, AMYLASE in the last 8760 hours. No results for input(s): AMMONIA in the last 8760 hours. CBC:  Recent Labs  09/07/14 06/09/15  WBC 7.9 10.5  HGB 12.3* 10.0*  HCT 38* 32*  PLT 260 430*   CBG:  Recent Labs  12/02/14 1818  GLUCAP 70     Assessment/Plan  Blood loss anemia Post op, with drop in Hb start him on feso4 325 mg bid and check cbc in 1 week. Monitor for bleed. Monitor vital signs    Blanchie Serve, MD Internal Medicine [facility] [place], [place] [address] Cell Phone (Monday-Friday 8 am - 5 pm): [phone] On Call: [phone] and follow prompts after 5 pm and on weekends Office  Phone: [phone] Office Fax: [phone]

## 2015-06-25 ENCOUNTER — Encounter: Payer: Self-pay | Admitting: Adult Health

## 2015-06-25 ENCOUNTER — Non-Acute Institutional Stay (SKILLED_NURSING_FACILITY): Payer: Self-pay | Admitting: Adult Health

## 2015-06-25 DIAGNOSIS — M1611 Unilateral primary osteoarthritis, right hip: Secondary | ICD-10-CM

## 2015-06-25 DIAGNOSIS — D62 Acute posthemorrhagic anemia: Secondary | ICD-10-CM

## 2015-06-25 DIAGNOSIS — R112 Nausea with vomiting, unspecified: Secondary | ICD-10-CM

## 2015-06-25 DIAGNOSIS — K219 Gastro-esophageal reflux disease without esophagitis: Secondary | ICD-10-CM

## 2015-06-25 DIAGNOSIS — K5901 Slow transit constipation: Secondary | ICD-10-CM

## 2015-06-25 NOTE — Progress Notes (Signed)
Patient ID: Justin Cain, male   DOB: 1955-01-18, 60 y.o.   MRN: [id number]     DATE:  06/25/2015   MRN:  [id number]  BIRTHDAY: Jul 20, 1955  Facility:  Nursing Home Location:  [place] Room Number: 807-P  LEVEL OF CARE:  SNF (31)  Contact Information    Name Relation Home Work Mobile   [place] Brother [phone]         Code Status History    This patient does not have a recorded code status. Please follow your organizational policy for patients in this situation.       Chief Complaint  Patient presents with  . Discharge Note    HISTORY OF PRESENT ILLNESS:  This is a 60 year old male who is DME for discharge home with home health PT for endurance and OT for ADLs. DME:  Rolling walker and 3-in-1 bedside commode  He has been admitted to [facility] on 06/04/15 from [place]. He has right hip osteoarthritis for which he had right total hip arthroplasty on Jun 01, 2015.   Patient was admitted to this facility for short-term rehabilitation after the patient's recent hospitalization.  Patient has completed SNF rehabilitation and therapy has cleared the patient for discharge.  PAST MEDICAL HISTORY:  Past Medical History  Diagnosis Date  . GERD (gastroesophageal reflux disease)   . Hyperlipidemia   . Morbid obesity ([place])   . Colon cancer ([place])   . Anemia   . Prostate cancer ([place])   . Displacement of lumbar intervertebral disc without myelopathy   . Spinal stenosis of lumbar region   . Fatigue   . Body mass index 40.0-44.9, adult (H[facility])      CURRENT MEDICATIONS: Reviewed  Patient's Medications  New Prescriptions   No medications on file  Previous Medications   ACETAMINOPHEN (TYLENOL) 325 MG TABLET    Take 650 mg by mouth every 6 (six) hours as needed.    DOCUSATE SODIUM (COLACE) 100 MG CAPSULE    Take 100 mg by mouth 2 (two) times daily.   FERROUS SULFATE 325 (65 FE) MG TABLET    Take 325 mg by mouth 2 (two)  times daily with a meal.   METHOCARBAMOL (ROBAXIN) 500 MG TABLET    Take 1 tablet (500 mg total) by mouth 2 (two) times daily.   OMEPRAZOLE (PRILOSEC) 20 MG CAPSULE    Take 20 mg by mouth every morning.   ONDANSETRON (ZOFRAN) 4 MG TABLET    Take 4 mg by mouth.    OXYCODONE (OXY-IR) 5 MG CAPSULE    Take 5-10 mg by mouth every 6 (six) hours. 1 tablet for moderate pain, 2 tablets for severe pain   RANITIDINE (ZANTAC) 150 MG TABLET    Take 150 mg by mouth every morning.  Modified Medications   No medications on file  Discontinued Medications   ENOXAPARIN (LOVENOX) 40 MG/0.4ML INJECTION    Inject 40 mg into the skin daily.   TRAMADOL (ULTRAM) 50 MG TABLET    Take 50 mg by mouth every 6 (six) hours as needed.     Allergies  Allergen Reactions  . Penicillins     Has patient had a PCN reaction causing immediate rash, facial/tongue/throat swelling, SOB or lightheadedness with hypotension: No Has patient had a PCN reaction causing severe rash involving mucus membranes or skin necrosis: No Has patient had a PCN reaction that required hospitalization No Has patient had a PCN reaction occurring within the  last 10 years: No If all of the above answers are "NO", then may proceed with Cephalosporin use.      REVIEW OF SYSTEMS:  GENERAL: no change in appetite, no fatigue, no weight changes, no chills or weakness, +fever EYES: Denies change in vision, dry eyes, eye pain, itching or discharge EARS: Denies change in hearing, ringing in ears, or earache NOSE: Denies nasal congestion or epistaxis MOUTH and THROAT: Denies oral discomfort, gingival pain or bleeding, pain from teeth or hoarseness   RESPIRATORY: no cough, SOB, DOE, wheezing, hemoptysis CARDIAC: no chest pain, edema or palpitations GI: no abdominal pain, diarrhea, constipation, heart burn, nausea or vomiting GU: Denies dysuria, frequency, hematuria, incontinence, or discharge PSYCHIATRIC: Denies feeling of depression or anxiety. No report  of hallucinations, insomnia, paranoia, or agitation   PHYSICAL EXAMINATION  GENERAL APPEARANCE: Well nourished. In no acute distress. Obese  SKIN:  Right hip surgical incision has steri-strips, dry, no erythema HEAD: Normal in size and contour. No evidence of trauma EYES: Lids open and close normally. No blepharitis, entropion or ectropion. PERRL. Conjunctivae are clear and sclerae are white. Lenses are without opacity EARS: Pinnae are normal. Patient hears normal voice tunes of the examiner MOUTH and THROAT: Lips are without lesions. Oral mucosa is moist and without lesions. Tongue is normal in shape, size, and color and without lesions NECK: supple, trachea midline, no neck masses, no thyroid tenderness, no thyromegaly LYMPHATICS: no LAN in the neck, no supraclavicular LAN RESPIRATORY: breathing is even & unlabored, BS CTAB CARDIAC: RRR, no murmur,no extra heart sounds, RLE trace edema  GI: abdomen soft, normal BS, no masses, no tenderness, no hepatomegaly, no splenomegaly EXTREMITIES:  Able to move 4 extremities PSYCHIATRIC: Alert and oriented X 3. Affect and behavior are appropriate  LABS/RADIOLOGY: Labs reviewed: 06/03/15   WBC 14.4 hemoglobin 11.1 hematocrit 34.0 06/02/15   sodium 140 potassium 4.9 glucose 135 BUN 16 creatinine 0.8 Basic Metabolic Panel:  Recent Labs  09/04/14 09/07/14 06/09/15  NA 136* 138 142  K 4.5 4.1 3.6  BUN 19 10 14   CREATININE 0.8 0.7 0.8   Liver Function Tests:  Recent Labs  09/07/14  AST 95*  ALT 100*  ALKPHOS 69   CBC:  Recent Labs  09/07/14 06/09/15  WBC 7.9 10.5  HGB 12.3* 10.0*  HCT 38* 32*  PLT 260 430*    CBG:  Recent Labs  12/02/14 1818  GLUCAP 70       ASSESSMENT/PLAN:  Right hip osteoarthritis S/P  Right total hip arthroplasty - - for Home health PT and OT; continue oxycodone 5 mg 1-2 tabs by mouth every 6 hours when necessary for pain; Robaxin 500 mg 1 tab by mouth twice a day for muscle spasm; follow-up with  orthopedic surgeon   Nausea - continue Zofran 4 mg 1 tab by mouth every 4 hours when necessary   Anemia, acute blood loss - hemoglobin 11.1; re-check  hgb 10.0, stable, continue ferrous sulfate 325 mg 1 tab by mouth 3 times a day  GERD - continue Zantac 150 mg 1 tab PO Q D and omeprazole 20 mg 1 capsule PO Q AM ac  Constipation - continue Colace 100 mg 1 capsule PO BID     I have filled out patient's discharge paperwork and written prescriptions.  Patient will receive home health PT and OT.  DME provided:  Rolling walker and 3-in-1 bedside commode  Total discharge time: Greater than 30 minutes  Discharge time involved coordination of the discharge  process with Education officer, museum, nursing staff and therapy department. Medical justification for home health services/DME verified.     Durenda Age, NP [employer] [phone]

## 2015-06-29 ENCOUNTER — Telehealth: Payer: Self-pay | Admitting: Internal Medicine

## 2015-06-29 NOTE — Telephone Encounter (Signed)
Patient would like to know if he could reestablish with Dr.John. States he was just discharged from the hospital from having hip surgery due to an accident at work and the hospital was wanting him to find a PCP to set up home health.Marland Kitchen

## 2015-06-30 NOTE — Telephone Encounter (Signed)
Ok with me 

## 2015-07-01 NOTE — Telephone Encounter (Signed)
Got scheduled  °

## 2015-07-08 ENCOUNTER — Ambulatory Visit: Payer: Self-pay | Admitting: Internal Medicine

## 2015-08-05 ENCOUNTER — Ambulatory Visit: Payer: Self-pay | Admitting: Internal Medicine

## 2015-10-26 ENCOUNTER — Encounter: Payer: Self-pay | Admitting: Internal Medicine

## 2015-10-26 ENCOUNTER — Non-Acute Institutional Stay (SKILLED_NURSING_FACILITY): Payer: Self-pay | Admitting: Internal Medicine

## 2015-10-26 DIAGNOSIS — R600 Localized edema: Secondary | ICD-10-CM

## 2015-10-26 DIAGNOSIS — D62 Acute posthemorrhagic anemia: Secondary | ICD-10-CM

## 2015-10-26 DIAGNOSIS — M1612 Unilateral primary osteoarthritis, left hip: Secondary | ICD-10-CM

## 2015-10-26 DIAGNOSIS — K219 Gastro-esophageal reflux disease without esophagitis: Secondary | ICD-10-CM

## 2015-10-26 DIAGNOSIS — R2681 Unsteadiness on feet: Secondary | ICD-10-CM

## 2015-10-26 DIAGNOSIS — K5909 Other constipation: Secondary | ICD-10-CM

## 2015-10-26 NOTE — Progress Notes (Signed)
LOCATION: [facility]  PCP: Cathlean Cower, MD   Code Status: Full Code  Goals of care: Advanced Directive information Advanced Directives 12/02/2014  Does patient have an advance directive? No  Would patient like information on creating an advanced directive? No - patient declined information       Extended Emergency Contact Information Primary Emergency Contact: Morgan,Sydney Address: [place], [place] [address] [place] Phone: [phone] Relation: Brother   Allergies  Allergen Reactions  . Bee Venom   . Nsaids   . Penicillins     Has patient had a PCN reaction causing immediate rash, facial/tongue/throat swelling, SOB or lightheadedness with hypotension: No Has patient had a PCN reaction causing severe rash involving mucus membranes or skin necrosis: No Has patient had a PCN reaction that required hospitalization No Has patient had a PCN reaction occurring within the last 10 years: No If all of the above answers are "NO", then may proceed with Cephalosporin use.     Chief Complaint  Patient presents with  . New Admit To SNF    New Admission Visit     HPI:  Patient is a 60 y.o. male seen today for short term rehabilitation post hospital admission from 10/22/15-10/25/15 with left hip OA. He underwent left total hip arthroplasty. He is seen in his room today.   Review of Systems:  Constitutional: Negative for fever, chills. Feels weak and tired. HENT: Negative for headache, congestion, nasal discharge, sore throat, difficulty swallowing.   Eyes: Negative for blurred vision, double vision and discharge. Wears glasses. Respiratory: Negative for cough, shortness of breath and wheezing.   Cardiovascular: Negative for chest pain, palpitations, leg swelling.  Gastrointestinal: Negative for heartburn, nausea, vomiting, abdominal pain, loss of appetite. Last bowel movement was yesterday.. Genitourinary: Negative for dysuria  and flank pain.  Musculoskeletal: Negative for fall in the facility. pain med helps.  Skin: Negative for itching, rash.  Neurological: Negative for dizziness. Psychiatric/Behavioral: Negative for depression   Past Medical History:  Diagnosis Date  . Anemia   . Body mass index 40.0-44.9, adult ([place])   . Colon cancer ([place])   . Displacement of lumbar intervertebral disc without myelopathy   . Fatigue   . GERD (gastroesophageal reflux disease)   . Hyperlipidemia   . Morbid obesity ([place])   . Prostate cancer ([place])   . Spinal stenosis of lumbar region    Past Surgical History:  Procedure Laterality Date  . COLON SURGERY    . KNEE ARTHROSCOPY  1980  . LAPAROSCOPY    . LONGITUDINAL GASTRECTOMY  09/03/14  . PROSTATE SURGERY     Social History:   reports that he has never smoked. He has never used smokeless tobacco. He reports that he drinks alcohol. He reports that he does not use drugs.  No family history on file.  Medications:   Medication List       Accurate as of 10/26/15  1:18 PM. Always use your most recent med list.          aspirin EC 325 MG tablet Take 325 mg by mouth daily.   docusate sodium 100 MG capsule Commonly known as:  COLACE Take 100 mg by mouth 2 (two) times daily.   omeprazole 20 MG capsule Commonly known as:  PRILOSEC Take 20 mg by mouth every morning.   oxycodone 5 MG capsule Commonly known as:  OXY-IR Take 1-3 mg by mouth  every 3 (three) hours as needed. 1 tablet for moderate pain, 2 tablets for severe pain   ranitidine 150 MG tablet Commonly known as:  ZANTAC Take 150 mg by mouth every morning.       Immunizations: Immunization History  Administered Date(s) Administered  . Influenza Whole 11/22/2007  . PPD Test 06/07/2015  . Pneumococcal-Unspecified 06/19/2015  . Td 11/22/2007     Physical Exam:  Vitals:   10/26/15 1311  BP: (!) 127/56  Pulse: 79  Resp: 20  Temp: 99.7 F (37.6 C)  TempSrc: Oral  Weight: 247 lb (112 kg)   Height: 5\' 6"  (1.676 m)   Body mass index is 39.87 kg/m.  General- elderly obese male, in no acute distress Head- normocephalic, atraumatic Nose- no nasal discharge Throat- moist mucus membrane Eyes- PERRLA, EOMI, no pallor, no icterus, no discharge, normal conjunctiva, normal sclera Neck- no cervical lymphadenopathy Cardiovascular- normal s1,s2, no murmur, trace leg edema Respiratory- bilateral clear to auscultation, no wheeze, no rhonchi, no crackles, no use of accessory muscles Abdomen- bowel sounds present, soft, non tender Musculoskeletal- able to move all 4 extremities, generalized weakness, limited left hip ROM Neurological- alert and oriented to person, place and time Skin- warm and dry, left hip surgical incision with aquacel dressing in place, clean and dry Psychiatry- normal mood and affect    Labs reviewed: Basic Metabolic Panel:  Recent Labs  06/09/15  NA 142  K 3.6  BUN 14  CREATININE 0.8   Liver Function Tests: No results for input(s): AST, ALT, ALKPHOS, BILITOT, PROT, ALBUMIN in the last 8760 hours. No results for input(s): LIPASE, AMYLASE in the last 8760 hours. No results for input(s): AMMONIA in the last 8760 hours. CBC:  Recent Labs  06/09/15  WBC 10.5  HGB 10.0*  HCT 32*  PLT 430*   Cardiac Enzymes: No results for input(s): CKTOTAL, CKMB, CKMBINDEX, TROPONINI in the last 8760 hours. BNP: Invalid input(s): POCBNP CBG:  Recent Labs  12/02/14 1818  GLUCAP 70    Radiological Exams: No results found.  Assessment/Plan  Unsteady gait Post left hip surgery, Will have patient work with PT/OT as tolerated to regain strength and restore function.  Fall precautions are in place.  left hip osteoarthritis  S/P  Left total hip arthroplasty. Has follow up with orthopedics. Continue oxycodone IR 5 mg 1-3 tab q3h prn pain. Add robaxin 500 mg q6h prn for muscle spasm. Get PMR to evaluate. LLE WBAT. Continue aspirin 325 mg bid for dvt prophylaxis.  Will have him work with physical therapy and occupational therapy team to help with gait training and muscle strengthening exercises.fall precautions. Skin care. Encourage to be out of bed.   Leg edema Trace, likely from stasis. Add ted hose  Blood loss anemia Post op, monitor cbc  gerd Stable on ranitidine and prilosec, monitor  Constipation Continue colace 100 mg bid, hydration encouraged   Goals of care: short term rehabilitation   Labs/tests ordered: cbc, cmp  Family/ staff Communication: reviewed care plan with patient and nursing supervisor    Blanchie Serve, MD Internal Medicine [place], [place] [address] Cell Phone (Monday-Friday 8 am - 5 pm): [phone] On Call: [phone] and follow prompts after 5 pm and on weekends Office Phone: [phone] Office Fax: [phone]

## 2015-10-29 LAB — HEPATIC FUNCTION PANEL
ALT: 34 U/L (ref 10–40)
AST: 30 U/L (ref 14–40)
Alkaline Phosphatase: 78 U/L (ref 25–125)
BILIRUBIN, TOTAL: 0.6 mg/dL

## 2015-10-29 LAB — CBC AND DIFFERENTIAL
HEMATOCRIT: 34 % — AB (ref 41–53)
HEMOGLOBIN: 10.7 g/dL — AB (ref 13.5–17.5)
NEUTROS ABS: 5 /uL
PLATELETS: 372 10*3/uL (ref 150–399)
WBC: 10.1 10*3/mL

## 2015-10-29 LAB — BASIC METABOLIC PANEL
BUN: 13 mg/dL (ref 4–21)
Creatinine: 0.7 mg/dL (ref 0.6–1.3)
Glucose: 114 mg/dL
Potassium: 3.8 mmol/L (ref 3.4–5.3)
Sodium: 141 mmol/L (ref 137–147)

## 2015-11-08 ENCOUNTER — Other Ambulatory Visit: Payer: Self-pay

## 2015-11-08 ENCOUNTER — Encounter: Payer: Self-pay | Admitting: Adult Health

## 2015-11-08 ENCOUNTER — Non-Acute Institutional Stay (SKILLED_NURSING_FACILITY): Payer: Self-pay | Admitting: Adult Health

## 2015-11-08 DIAGNOSIS — K5901 Slow transit constipation: Secondary | ICD-10-CM

## 2015-11-08 DIAGNOSIS — D62 Acute posthemorrhagic anemia: Secondary | ICD-10-CM

## 2015-11-08 DIAGNOSIS — K219 Gastro-esophageal reflux disease without esophagitis: Secondary | ICD-10-CM

## 2015-11-08 DIAGNOSIS — M1612 Unilateral primary osteoarthritis, left hip: Secondary | ICD-10-CM

## 2015-11-08 DIAGNOSIS — R2681 Unsteadiness on feet: Secondary | ICD-10-CM

## 2015-11-08 MED ORDER — OXYCODONE HCL 5 MG PO CAPS: 1.0000 mg | ORAL_CAPSULE | ORAL | 0 refills | Status: DC | PRN

## 2015-11-08 NOTE — Progress Notes (Signed)
Patient ID: Justin Grumet., male   DOB: [birth year], 60 y.o.   MRN: [id number]    DATE:  11/08/2015   MRN:  [id number]  BIRTHDAY: 05/13/1955  Facility:  Nursing Home Location:  [place] Room Number: 101-P  LEVEL OF CARE:  SNF (31)  Contact Information    Name Relation Home Work Mobile   [place] Brother [phone]         Code Status History    This patient does not have a recorded code status. Please follow your organizational policy for patients in this situation.       Chief Complaint  Patient presents with  . Discharge Note    HISTORY OF PRESENT ILLNESS:  This is a 60 year old male who is for discharge home with Home health PT, OT, CNA and Nursing. DME:  Bariatric commode, shower bench and rolling walker with tray.  He has been admitted to [facility] on 10/25/15 from [facility] hospitalization 10/22/15 - 10/25/15 . He has left hip osteoarthritis for which he had left total hip arthroplasty.  Patient was admitted to this facility for short-term rehabilitation after the patient's recent hospitalization.  Patient has completed SNF rehabilitation and therapy has cleared the patient for discharge.   PAST MEDICAL HISTORY:  Past Medical History:  Diagnosis Date  . Anemia   . Body mass index 40.0-44.9, adult ([place])   . Colon cancer ([place])   . Displacement of lumbar intervertebral disc without myelopathy   . Fatigue   . GERD (gastroesophageal reflux disease)   . Hyperlipidemia   . Morbid obesity ([place])   . Prostate cancer ([place])   . Spinal stenosis of lumbar region      CURRENT MEDICATIONS: Reviewed  Patient's Medications  New Prescriptions   No medications on file  Previous Medications   ASPIRIN EC 325 MG TABLET    Take 325 mg by mouth daily.   DOCUSATE SODIUM (COLACE) 100 MG CAPSULE    Take 100 mg by mouth 2 (two) times daily.    METHOCARBAMOL (ROBAXIN) 500 MG TABLET    Take 500 mg by mouth every 6  (six) hours as needed for muscle spasms.   OMEPRAZOLE (PRILOSEC) 20 MG CAPSULE    Take 20 mg by mouth every morning.    PROTEIN (PROCEL) POWD    Take 1 scoop by mouth 2 (two) times daily.   RANITIDINE (ZANTAC) 150 MG TABLET    Take 150 mg by mouth every morning.   Modified Medications   Modified Medication Previous Medication   OXYCODONE (OXY-IR) 5 MG CAPSULE oxycodone (OXY-IR) 5 MG capsule      Take 1 capsule (5 mg total) by mouth every 3 (three) hours as needed. 1 tablet for moderate pain, 2 tablets for severe pain    Take 1-3 mg by mouth every 3 (three) hours as needed. 1 tablet for moderate pain, 2 tablets for severe pain  Discontinued Medications   No medications on file     Allergies  Allergen Reactions  . Bee Venom   . Nsaids   . Penicillins     Has patient had a PCN reaction causing immediate rash, facial/tongue/throat swelling, SOB or lightheadedness with hypotension: No Has patient had a PCN reaction causing severe rash involving mucus membranes or skin necrosis: No Has patient had a PCN reaction that required hospitalization No Has patient had a PCN reaction occurring within the last 10 years: No If all of the  above answers are "NO", then may proceed with Cephalosporin use.      REVIEW OF SYSTEMS:  GENERAL: no change in appetite, no fatigue, no weight changes, no fever, chills or weakness EYES: Denies change in vision, dry eyes, eye pain, itching or discharge EARS: Denies change in hearing, ringing in ears, or earache NOSE: Denies nasal congestion or epistaxis MOUTH and THROAT: Denies oral discomfort, gingival pain or bleeding, pain from teeth or hoarseness   RESPIRATORY: no cough, SOB, DOE, wheezing, hemoptysis CARDIAC: no chest pain, edema or palpitations GI: no abdominal pain, diarrhea, constipation, heart burn, nausea or vomiting GU: Denies dysuria, frequency, hematuria, incontinence, or discharge PSYCHIATRIC: Denies feeling of depression or anxiety. No report of  hallucinations, insomnia, paranoia, or agitation   PHYSICAL EXAMINATION  GENERAL APPEARANCE: Well nourished. In no acute distress. Obese SKIN:  Left hip posterior area has steri-strips, no redness nor edema noted HEAD: Normal in size and contour. No evidence of trauma EYES: Lids open and close normally. No blepharitis, entropion or ectropion. PERRL. Conjunctivae are clear and sclerae are white. Lenses are without opacity EARS: Pinnae are normal. Patient hears normal voice tunes of the examiner MOUTH and THROAT: Lips are without lesions. Oral mucosa is moist and without lesions. Tongue is normal in shape, size, and color and without lesions NECK: supple, trachea midline, no neck masses, no thyroid tenderness, no thyromegaly LYMPHATICS: no LAN in the neck, no supraclavicular LAN RESPIRATORY: breathing is even & unlabored, BS CTAB CARDIAC: RRR, no murmur,no extra heart sounds, no edema GI: abdomen soft, normal BS, no masses, no tenderness, no hepatomegaly, no splenomegaly EXTREMITIES:  Able to move X 4 extremities PSYCHIATRIC: Alert and oriented X 3. Affect and behavior are appropriate  LABS/RADIOLOGY: Labs reviewed: Basic Metabolic Panel:  Recent Labs  06/09/15 10/29/15  NA 142 141  K 3.6 3.8  BUN 14 13  CREATININE 0.8 0.7   Liver Function Tests:  Recent Labs  10/29/15  AST 30  ALT 34  ALKPHOS 78   CBC:  Recent Labs  06/09/15 10/29/15  WBC 10.5 10.1  NEUTROABS  --  5  HGB 10.0* 10.7*  HCT 32* 34*  PLT 430* 372   CBG:  Recent Labs  12/02/14 1818  GLUCAP 70       ASSESSMENT/PLAN:  Unsteady gait - for Home health PT, OT, CNA and Nursing, for therapeutic strengthening exercises; fall precaution    Left hip osteoarthritis S/P left total hip arthroplasty - for Home health PT, OT, CNA and Nursing, for therapeutic strengthening exercises; LLE WBAT; ASA EC 325 mg 1 tab PO Q D for DVT prophylaxis; Robaxin 500 mg 1 tab PO Q 6 hours PRN for muscle spasm; Oxycodone 5  mg 1-3 tabs PO Q 3 hours PRN for pain; follow-up with orthopedic surgeon  GERD - continue Omeprazole 20 mg 1 capsule PO Q D and Ranitidine 150 mg 1 tab PO Q D  Constipation - continue Colace 100 mg 1 capsule PO BID  Anemia, acute blood loss - stable Lab Results  Component Value Date   HGB 10.7 (A) 10/29/2015       I have filled out patient's discharge paperwork and written prescriptions.  Patient will receive home health PT, OT, Nursing and CNA.  DME provided:  Bariatric commode, shower bench and rolling walker with tray  Total discharge time: Greater than 30 minutes Greater than 50% was spent in counseling and coordination of care with the patient.   Discharge time involved coordination of the  discharge process with Education officer, museum, nursing staff and therapy department. Medical justification for home health services/DME verified.     Durenda Age, NP [employer] [phone]

## 2015-11-08 NOTE — Telephone Encounter (Signed)
Rx faxed to [facility] @ [phone], phone number [phone]  

## 2015-11-12 DIAGNOSIS — R2689 Other abnormalities of gait and mobility: Secondary | ICD-10-CM

## 2015-11-12 DIAGNOSIS — Z96642 Presence of left artificial hip joint: Secondary | ICD-10-CM

## 2017-03-31 IMAGING — CR DG CERVICAL SPINE COMPLETE 4+V
8 series · 8 of 8 positions shown · non-contrast
Comparison: None.

CLINICAL DATA: Status post fall. No loss of consciousness or
reported neck pain. Chronic back pain. Initial encounter.

EXAM:
CERVICAL SPINE - COMPLETE 4+ VIEW

[w cervical spine lat]
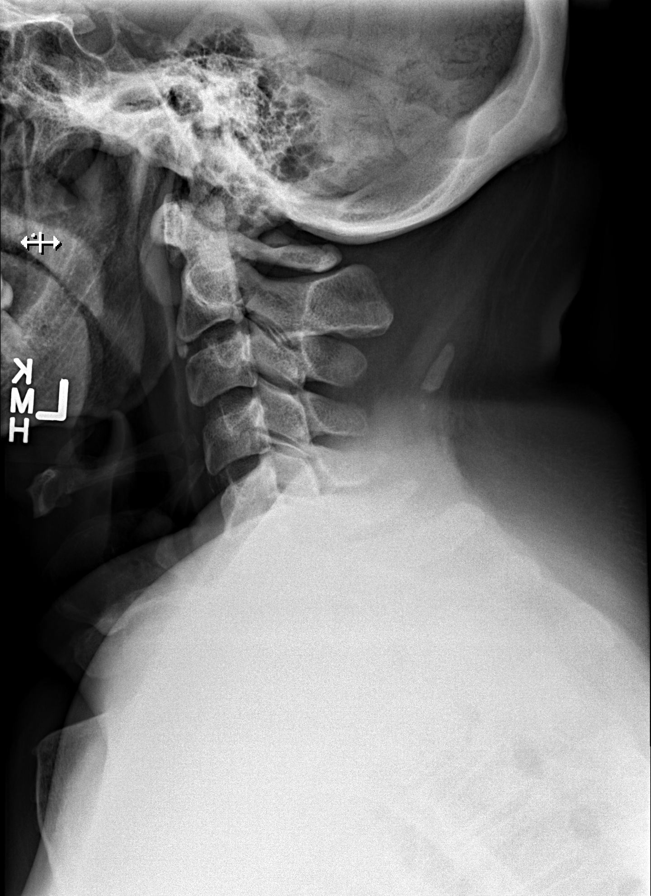

[w cervical spine ap_obl (1 of 2)]
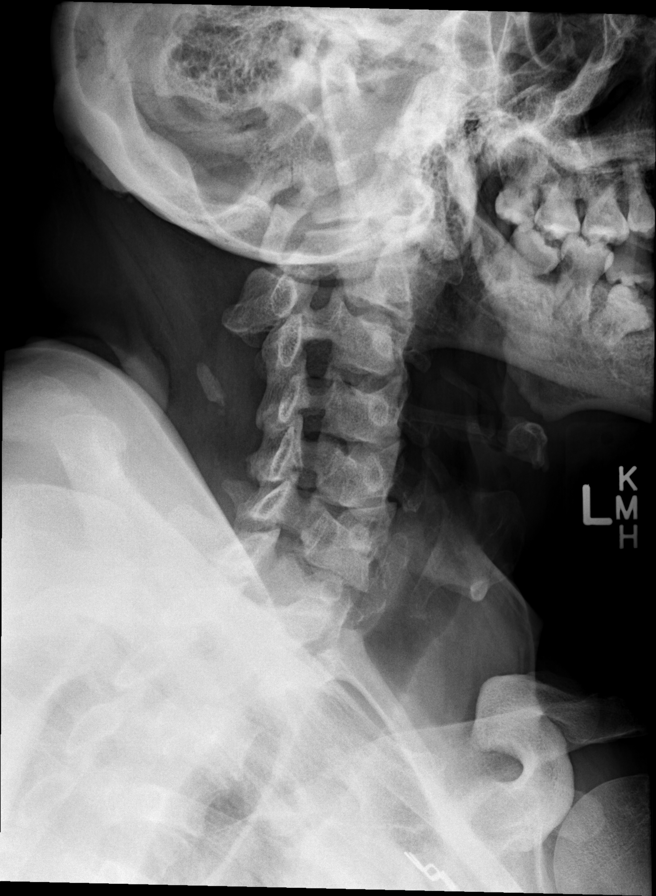

[w cervical spine ap_obl (2 of 2)]
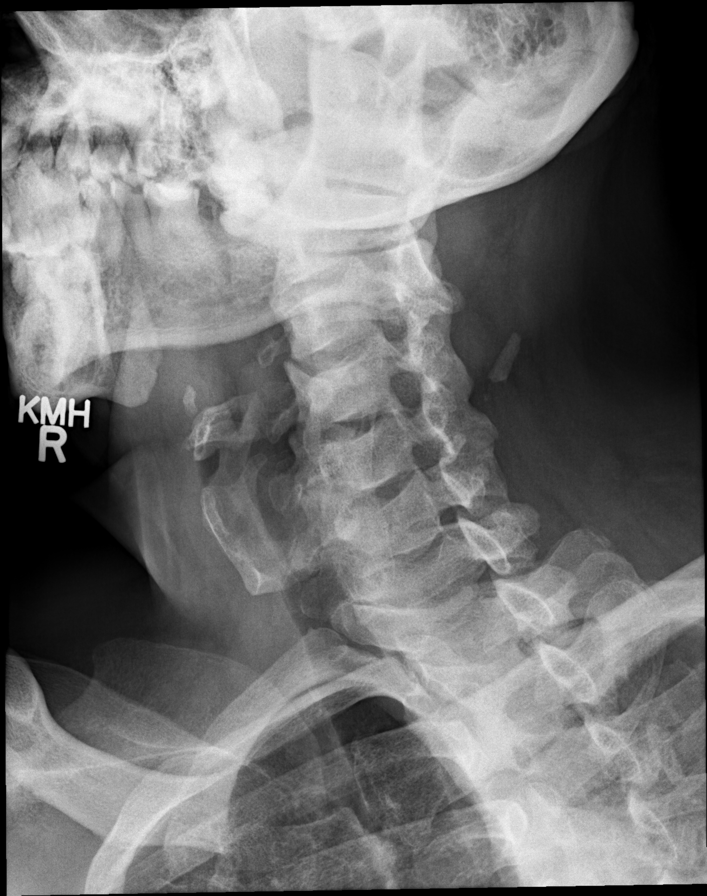

[w cervical spine ap]
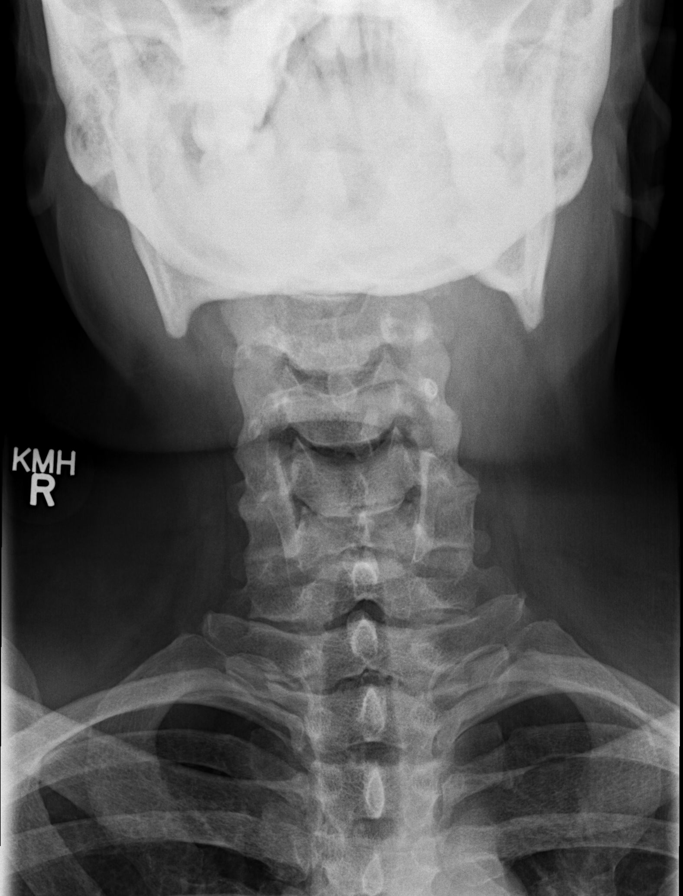

[w cervical spine odontoid (1 of 2)]
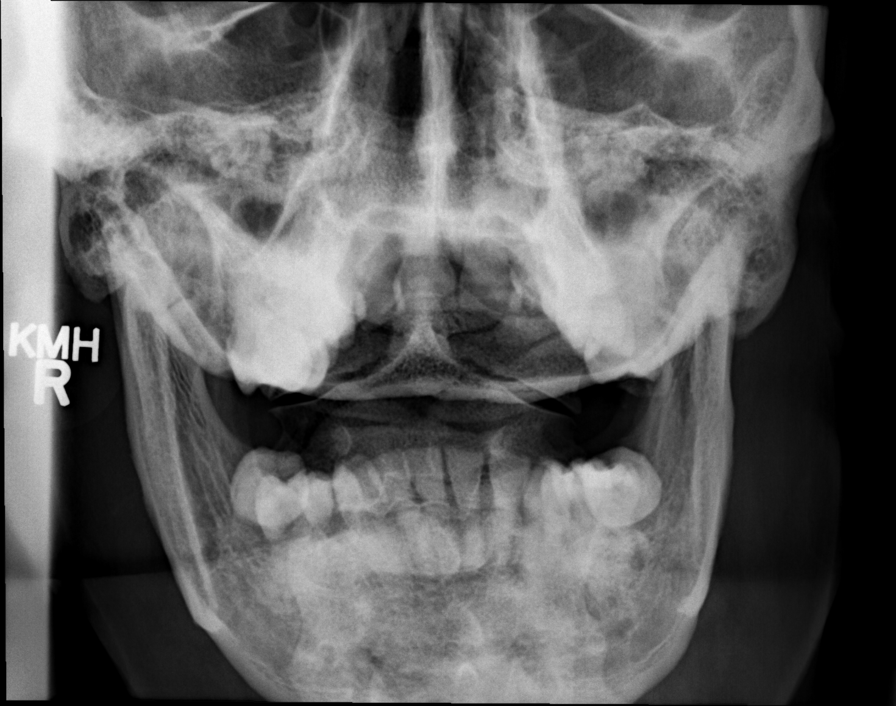

[w cervical spine odontoid (2 of 2)]
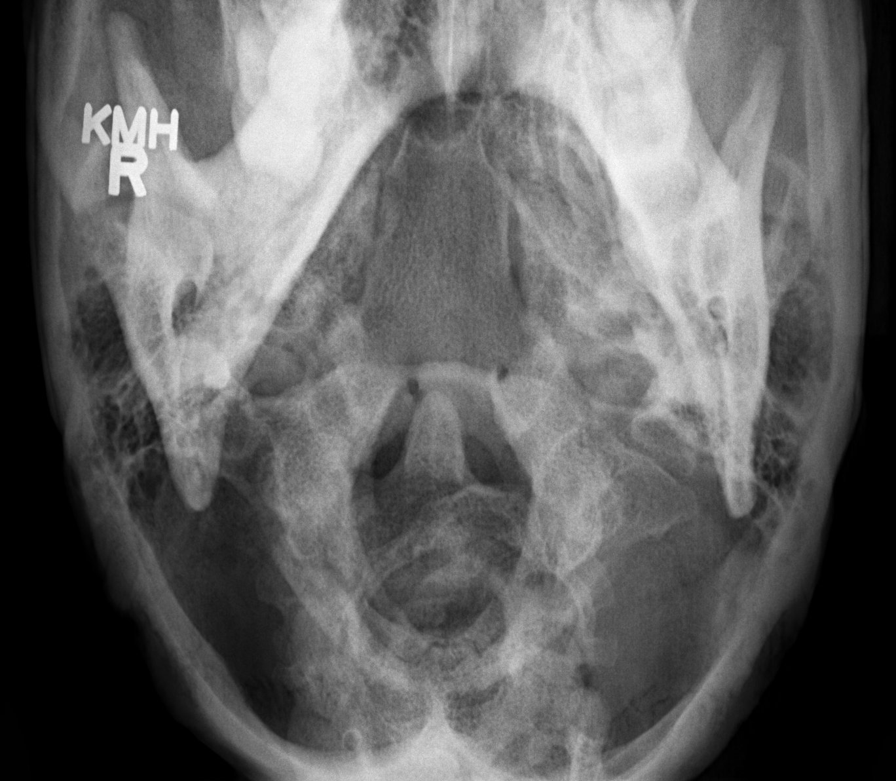

[w cervical swimmers (1 of 2)]
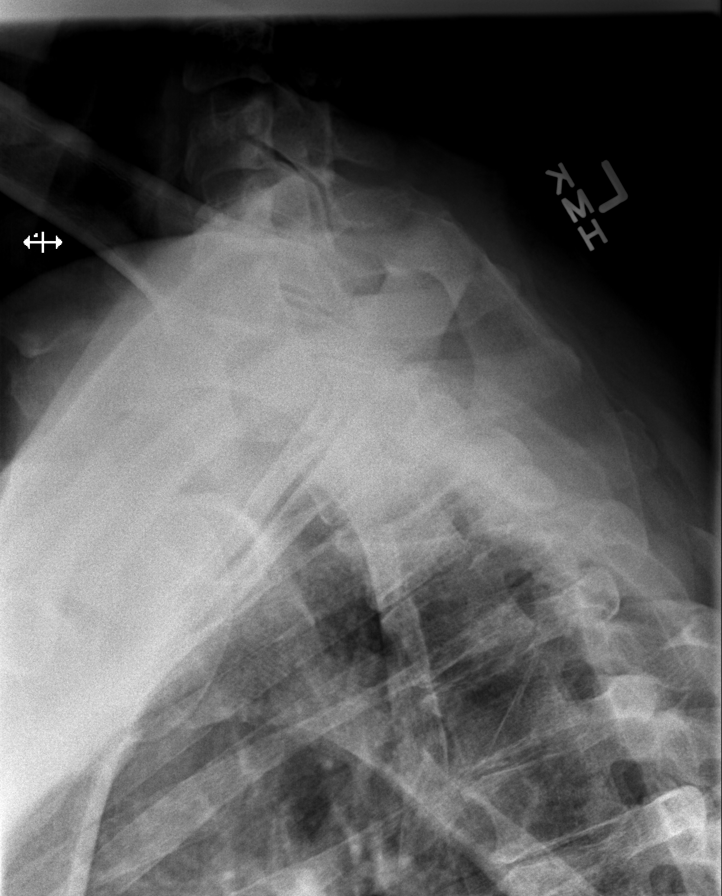

[w cervical swimmers (2 of 2)]
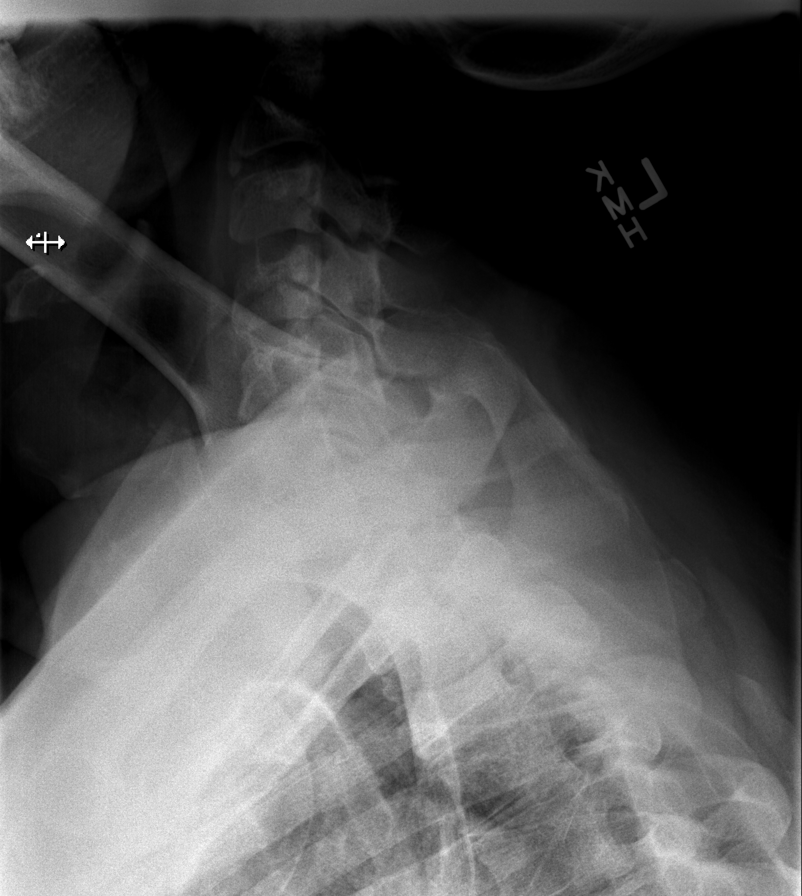

[8 of 8 positions shown; findings below may reference images not displayed]

FINDINGS: The prevertebral soft tissues appear normal. The alignment is
anatomic through T1. The lower cervical spine is not well visualized
in the lateral projection due to body habitus. There is no evidence
of acute fracture or traumatic subluxation. There is mild facet
hypertrophy and uncinate spurring. The posterior arch of C1 appears
incomplete. Ossification of the ligamentum nuchae noted.
IMPRESSION: No evidence of acute cervical spine fracture, traumatic subluxation
or static signs of instability. Mild spondylosis.

## 2019-11-03 NOTE — Progress Notes (Signed)
 Subjective   Patient ID:  Justin Cain. is a 64 y.o. (DOB 17-Jan-1955) male. He has a     Patient presents with   Follow-up     History of Present Illness:  He presents for chronic care follow-up. He does admit that he is seen at outside primary care clinic Recently had labs done and was told to take iron He was told to lead a heart healthy diet.  On the note of taking iron, he recently had his colonoscopy He has a history colon cancer.  No signs of polyps or cancer. Non-bleeding internal hemorrhoids F/U in 10 years - he's happy about that He would like screening for prostate cancer No history of elevated PSA No complaints about nocturia, hematuria    Medications:  Current Outpatient Medications  Medication Instructions   amLODIPine besylate (NORVASC) 5 mg, Oral, Daily   aspirin (ASPRI-LOW,ECOTRIN LOW DOSE) 162 mg, Oral   cyclobenzaprine  (FLEXERIL ) 10 mg tablet No dose, route, or frequency recorded.   docusate sodium  (COLACE,DOK,DOCQLACE) 100 mg, Oral   gabapentin  (NEURONTIN ) 100 mg capsule No dose, route, or frequency recorded.   hydrochlorothiazide (HYDRODIURIL) 12.5 mg tablet No dose, route, or frequency recorded.   ibuprofen (ADVIL,MOTRIN) 800 mg tablet No dose, route, or frequency recorded.   ketoconazole (NIZORAL) 2 % cream No dose, route, or frequency recorded.   Loratadine (CLARITIN PO) Oral   methocarbamol  (ROBAXIN ) 500 mg, Oral   Multiple Vitamins-Minerals (CENTRUM MULTIGUMMIES PO) Oral   NARCAN 4 MG/0.1ML LIQD nasal spray No dose, route, or frequency recorded.   omeprazole (PRILOSEC) 20 mg, Oral   oxyCODONE -acetaminophen  (PERCOCET,ENDOCET) 5-325 mg per tablet No dose, route, or frequency recorded.      Lab Results  Component Value Date   Creatinine 1.18 11/03/2019      Health Care Maintenance:  Health Maintenance  Topic Date Due   COVID-19 Vaccine (1) Never done   Medicare Annual Wellness  Never done   DTaP/Tdap/Td Vaccines  (1 - Tdap) 11/23/2007   Zoster Vaccine (2 of 2) 10/31/2019   Creatinine Level  11/02/2020   Potassium Level  11/02/2020   Sodium  11/02/2020   Colonoscopy  10/30/2029   Influenza Vaccine  Completed   Pneumococcal Vaccine: Pediatrics and At Risk Patients  Aged Out   Hepatitis B Vaccine  Aged Out   Hepatitis A Vaccine  Aged Out   Meningococcal Conjugate Vaccine  Aged Out     Health Maintenance Due  Topic Date Due   COVID-19 Vaccine (1) Never done   Medicare Annual Wellness  Never done   DTaP/Tdap/Td Vaccines (1 - Tdap) 11/23/2007   Zoster Vaccine (2 of 2) 10/31/2019     Immunization History  Administered Date(s) Administered   FLUCELVAX QUAD 24YRS AND OLDER 0.5ML PREFILLED 09/29/2019   FLUZONE QUAD 6MOS-17YRS 0.5ML SINGLE-DOSE VIAL 10/25/2018   Influenza Whole 11/22/2007   PPD Test 06/07/2015   Pneumococcal Polysaccharide 06/19/2015   Td 11/22/2007   Zoster Recombinant (Shingrix) 09/05/2019        10 Year ASCVD Risk: The ASCVD Risk score Verdon DC Jr., et al., 2013) failed to calculate for the following reasons:   Cannot find a previous HDL lab   Cannot find a previous total cholesterol lab   No results found for: HGBA1C   No results found for: CHOL No results found for: HDL No results found for: LDL No results found for: TRIG No results found for: CHOLHDL   BP Readings from Last 3 Encounters:  11/03/19  133/79  09/01/19 123/69  08/18/19 138/78       Reviewed and updated this visit by provider: Tobacco  Allergies  Meds  Problems  Med Hx  Surg Hx  Fam Hx  PDMP        Allergies  Allergen Reactions   Bee Venom Swelling   Penicillins Other    Pt passes out     Review of System:   POSITIVES IN BOLD   COVID-19: General: fever, chills HEENT: headache, loss of tastes/smell, nasal discharge, nasal congestion, sore throat  CV:  chest pain, exertional SOB Lungs: dyspnea,wheezing, cough Abdomen: abdominal pain, nausea,  vomiting, diarrhea Extremities: muscle pain  Depression: See PHQ-9  Other:   Objective   BP 133/79 (BP Location: Left arm, Patient Position: Sitting)   Pulse 81   Temp 97.2 F (36.2 C) (Oral)   Ht 5' 6 (1.676 m)   Wt 284 lb 12.8 oz (129.2 kg)   SpO2 100%   BMI 45.97 kg/m  General:  Well Developed, Pleasant, Alert, No Acute Distress, Obese Habitus. Lungs:  Breathing comfortably on room air.  Neurologic: Alert and oriented x 4 Psych: Cooperative with exam. Good eye contact. Euthymic mood.   Office Visit on 11/03/2019  Component Date Value Ref Range Status   Glucose 11/03/2019 95  65 - 99 mg/dL Final   BUN 89/74/7978 22  8 - 27 mg/dL Final   Creatinine 89/74/7978 1.18  0.76 - 1.27 mg/dL Final   eGFR If NonAfrican American 11/03/2019 65  >59 mL/min/1.73 Final   eGFR If African American 11/03/2019 75  >59 mL/min/1.73 Final   Comment: **In accordance with recommendations from the NKF-ASN Task force,**   [facility] is in the process of updating its eGFR calculation to the   2021 CKD-EPI creatinine equation that estimates kidney function   without a race variable.    BUN/Creatinine Ratio 11/03/2019 19  10 - 24 Final   Sodium 11/03/2019 140  134 - 144 mmol/L Final   Potassium 11/03/2019 5.3* 3.5 - 5.2 mmol/L Final   Chloride 11/03/2019 103  96 - 106 mmol/L Final   CO2 11/03/2019 24  20 - 29 mmol/L Final   CALCIUM  11/03/2019 9.7  8.6 - 10.2 mg/dL Final   Total Protein 89/74/7978 7.2  6.0 - 8.5 g/dL Final   Albumin, Serum 11/03/2019 4.4  3.8 - 4.8 g/dL Final   Globulin, Total 11/03/2019 2.8  1.5 - 4.5 g/dL Final   Albumin/Globulin Ratio 11/03/2019 1.6  1.2 - 2.2 Final   Total Bilirubin 11/03/2019 0.2  0.0 - 1.2 mg/dL Final   Alkaline Phosphatase 11/03/2019 92  44 - 121 IU/L Final                 **Please note reference interval change**   AST 11/03/2019 19  0 - 40 IU/L Final   ALT (SGPT) 11/03/2019 14  0 - 44 IU/L Final   Vitamin B-12 11/03/2019 572  232 -  1,245 pg/mL Final   Folate 11/03/2019 >20.0  >3.0 ng/mL Final   Comment: A serum folate concentration of less than 3.1 ng/mL is considered to represent clinical deficiency.    TIBC 11/03/2019 379  250 - 450 ug/dL Final   UIBC 89/74/7978 278  111 - 343 ug/dL Final   Iron 89/74/7978 101  38 - 169 ug/dL Final   Iron Saturation 11/03/2019 27  15 - 55 % Final   Ferritin 11/03/2019 69  30 - 400 ng/mL Final   WBC 11/03/2019 7.6  3.4 - 10.8 x10E3/uL Final   RBC 11/03/2019 4.29  4.14 - 5.80 x10E6/uL Final   Hemoglobin 11/03/2019 12.1* 13.0 - 17.7 g/dL Final   Hematocrit 89/74/7978 37.5  37.5 - 51.0 % Final   MCV 11/03/2019 87  79 - 97 fL Final   MCH 11/03/2019 28.2  26.6 - 33.0 pg Final   MCHC 11/03/2019 32.3  31.5 - 35.7 g/dL Final   RDW 89/74/7978 13.4  11.6 - 15.4 % Final   Platelet Count 11/03/2019 315  150 - 450 x10E3/uL Final   Neutrophils 11/03/2019 52  Not Estab. % Final   Lymphs Relative  11/03/2019 31  Not Estab. % Final   Monocytes 11/03/2019 14  Not Estab. % Final   Eos Relative  11/03/2019 2  Not Estab. % Final   Basos Relative  11/03/2019 1  Not Estab. % Final   Neutrophils Absolute 11/03/2019 4.0  1.4 - 7.0 x10E3/uL Final   Lymphocytes Absolute 11/03/2019 2.4  0.7 - 3.1 x10E3/uL Final   Monocytes Absolute 11/03/2019 1.1* 0.1 - 0.9 x10E3/uL Final   Eosinophils Absolute 11/03/2019 0.2  0.0 - 0.4 x10E3/uL Final   Basophils Absolute 11/03/2019 0.0  0.0 - 0.2 x10E3/uL Final   Immature Granulocytes 11/03/2019 0  Not Estab. % Final   Immature Grans (Abs) 11/03/2019 0.0  0.0 - 0.1 x10E3/uL Final   PSA 11/03/2019 0.2  0.0 - 4.0 ng/mL Final   Comment: Roche ECLIA methodology. According to the American Urological Association, Serum PSA should decrease and remain at undetectable levels after radical prostatectomy. The AUA defines biochemical recurrence as an initial PSA value 0.2 ng/mL or greater followed by a subsequent confirmatory PSA value 0.2 ng/mL or  greater. Values obtained with different assay methods or kits cannot be used interchangeably. Results cannot be interpreted as absolute evidence of the presence or absence of malignant disease.   ]   Assessment and Plan   Justin Cain was seen today for follow-up.  Diagnoses and all orders for this visit:  History of colon cancer  Grade I internal hemorrhoids  Normocytic anemia -     CBC And Differential; Future -     Fe+TIBC+Fer; Future -     Vitamin B12 and Folate; Future -     Vitamin B12 and Folate -     Fe+TIBC+Fer -     CBC And Differential  Screening for prostate cancer -     Prostatic Specific Ag; Future -     Prostatic Specific Ag  Essential hypertension -     Comprehensive metabolic panel; Future -     Comprehensive metabolic panel  Colon Cancer PMH with recent instructions to take iron: Colonoscopy reassuring. I'm unable to see his outside labs from the primary care office and review his CBC or Fe profile? He would like me to check today and I'm willing. However, I did encourage him to continue to follow their management plan and make a decision about where he would like to receive primary care so he can have a consistent management plan.   Other screening and labs as above.   Problem List      CARDIAC AND VASCULATURE   Essential hypertension   Relevant Orders   Comprehensive metabolic panel (Completed)     GASTROINTESTINAL AND ABDOMINAL   Grade I internal hemorrhoids     HEMATOLOGY AND NEOPLASIA   History of colon cancer - Primary   Overview    Formatting of this note might be different from the original.  Qualifier: Diagnosis of  By: Debrah MD, Lamar BIRCH      Normocytic anemia   Relevant Orders   CBC And Differential (Completed)   Fe+TIBC+Fer (Completed)   Vitamin B12 and Folate (Completed)       Labs reviewed with patient extensively and opportunity to ask questions given.  Above labs are future labs unless otherwise stated.  No follow-ups on  file.   Orders Placed This Encounter  Procedures   Prostatic Specific Ag   CBC And Differential   Fe+TIBC+Fer   Vitamin B12 and Folate   Comprehensive metabolic panel    Patient's Medications  New Prescriptions   No medications on file  Previous Medications   AMLODIPINE BESYLATE (NORVASC) 5 MG TABLET    Take 5 mg by mouth daily.   ASPIRIN (ASPRI-LOW,ECOTRIN LOW DOSE) 81 MG EC TABLET    Take 162 mg by mouth.   CYCLOBENZAPRINE  (FLEXERIL ) 10 MG TABLET       DOCUSATE SODIUM  (COLACE,DOK,DOCQLACE) CAPSULE    Take 100 mg by mouth.    GABAPENTIN  (NEURONTIN ) 100 MG CAPSULE       HYDROCHLOROTHIAZIDE (HYDRODIURIL) 12.5 MG TABLET       IBUPROFEN (ADVIL,MOTRIN) 800 MG TABLET       KETOCONAZOLE (NIZORAL) 2 % CREAM       LORATADINE (CLARITIN PO)    Take by mouth.   METHOCARBAMOL  (ROBAXIN ) 500 MG TABLET    Take 500 mg by mouth.   MULTIPLE VITAMINS-MINERALS (CENTRUM MULTIGUMMIES PO)    Take by mouth.   NARCAN 4 MG/0.1ML LIQD NASAL SPRAY       OMEPRAZOLE (PRILOSEC) 20 MG CAPSULE    Take 20 mg by mouth.   OXYCODONE -ACETAMINOPHEN  (PERCOCET,ENDOCET) 5-325 MG PER TABLET      Modified Medications   No medications on file  Discontinued Medications   No medications on file      Risks, benefits, and alternatives of the medications and treatment plan prescribed today were discussed, and patient expressed understanding. Plan follow-up as discussed or as needed if any worsening symptoms or change in condition.    This note was dictated with voice recognition software. Similar sounding words can inadvertently be transcribed and may not be corrected upon review.  Hosey DELENA Ned, MD 11/06/2019 / 9:40 AM

## 2019-12-10 NOTE — Progress Notes (Signed)
 Chief Complaint  Patient presents with   Obesity    Medical / Non-fasting   Neck Circumference    18 / No CPAP   Abdomen Circumference    59 Upper / 57.5 Lower   [address] Philip Kotlyar. is a 64 y.o.  male,  NOV,  a referral from Dr. Debby for obesity management. He has struggled with his weight since mid adult years. Is there a family hx of obesity? No He gained more weight over the years.  He reports the following comorbidities: HTN, heart disease, hip replacement He lives alone He is retired.  Low weight after SG was 200 lbs in 2016  He drinks 0 cups of sugary drinks per day.  Prefers coke zero. He drinks 40 oz. of water  per day. He uses  Almond milk for cooking and cereal.   He reports that His worst food habits include candy  He admits to snacking after dinner.  Chooses candy for a snack. denies stress eating. Goes out to eat or gets take out 2 times per week.  denies eating large amounts of food (especially in private) in one sitting and then feeling guilty about it.   24 hour diet recall shows the following:  Pork chop and greens at 8pm, skip lunch, cereal bacon and honey bun at 10 am,   He gets 8 hours of sleep at night. denies snoring. denies daytime hypersomnolence. He has had a sleep study. he does not use cpap or bipap at night.  Stressors: getting used to being retired, ex wife.   He does exercise.  walking  He has tried the following weight loss methods/ medications: none  He reports the following interventions that have helped in the past with weight loss: no fried foods  Social History   Substance and Sexual Activity  Sexual Activity Not on file      Surgical/ GI: patient denies history of colon cancer or gastric ulcers. Patient has had the following abdominal surgeries: SG in 2015  Epworth Sleepiness Scale    EPWORTH SLEEPINESS SCALE 12/10/2019   Sitting and reading 0   Watching TV 1   Sitting, inactive in a public place (e.g. a theatre or  a meeting) 0   As a passenger in a car for an hour without a break 0   Lying down to rest in the afternoon when circumstances permit 2   Sitting and talking to someone 0   Sitting quietly after a lunch without alcohol 0   In a car, while stopped for a few minutes in traffic 0   Total score 3       Social History   Substance and Sexual Activity  Sexual Activity Not on file     Allergies  Allergen Reactions   Bee Venom Swelling   Penicillins Other    Pt passes out    Current Outpatient Medications on File Prior to Visit  Medication Sig Dispense Refill   amLODIPine besylate (NORVASC) 5 mg tablet Take 5 mg by mouth daily.     aspirin (ASPRI-LOW,ECOTRIN LOW DOSE) 81 mg EC tablet Take 162 mg by mouth.     cyclobenzaprine  (FLEXERIL ) 10 mg tablet      docusate sodium  (COLACE,DOK,DOCQLACE) capsule Take 100 mg by mouth.      gabapentin  (NEURONTIN ) 300 mg capsule      hydrochlorothiazide (HYDRODIURIL) 12.5 mg tablet      ibuprofen (ADVIL,MOTRIN) 800 mg tablet      ketoconazole (NIZORAL) 2 % cream  Loratadine (CLARITIN PO) Take by mouth.     methocarbamol  (ROBAXIN ) 500 MG tablet Take 500 mg by mouth.     Multiple Vitamins-Minerals (CENTRUM MULTIGUMMIES PO) Take by mouth.     omeprazole (PRILOSEC) 20 mg capsule Take 20 mg by mouth.     oxyCODONE -acetaminophen  (PERCOCET,ENDOCET) 5-325 mg per tablet      gabapentin  (NEURONTIN ) 100 mg capsule  (Patient not taking: Reported on 12/10/2019)     NARCAN 4 MG/0.1ML LIQD nasal spray  (Patient not taking: Reported on 12/10/2019)     sildenafil citrate (VIAGRA) 100 mg tablet Take one tablet (100 mg dose) by mouth as needed for Erectile Dysfunction. (Patient not taking: Reported on 12/10/2019) 30 tablet 1   No current facility-administered medications on file prior to visit.     The patients allergies, current medications, past family history, past medical history, past social history, past surgical history, and problem list  were reviewed and updated as appropriate.  Past Medical History:  Diagnosis Date   Hypertension      Patient Active Problem List   Diagnosis Date Noted   Normocytic anemia 11/06/2019   Grade I internal hemorrhoids 11/03/2019   Chronic bilateral low back pain without sciatica 09/03/2019   Chronic pain of both hips 09/03/2019   BMI 40.0-44.9, adult (*) 09/01/2019   History of bilateral hip replacements 09/01/2019   History of gastric bypass 09/01/2019   Personal history of spine surgery 09/01/2019   Status post placement of implantable loop recorder 09/25/2018   Back pain 08/14/2018   Hip pain, bilateral 08/14/2018   Hx of total hip arthroplasty, bilateral 08/14/2018   Chest pain 07/18/2018   Essential hypertension 07/18/2018   Edema 07/18/2018   Sinus bradycardia 07/18/2018   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 Qualifier: Diagnosis of  By: Debrah MD, Lamar BIRCH    History of colon cancer 05/13/2008    Formatting of this note might be different from the original. Qualifier: Diagnosis of  By: Debrah MD, Lamar BIRCH    Personal history of malignant neoplasm of unspecified site in gastrointestinal tract 05/13/2008    Formatting of this note might be different from the original. Qualifier: Diagnosis of  By: Debrah MD, Lamar BIRCH    Abdominal tenderness, epigastric 03/17/2008    Formatting of this note might be different from the original. Qualifier: Diagnosis of  By: Norleen MD, Lynwood ORN    Diarrhea of presumed infectious origin 03/17/2008    Formatting of this note might be different from the original. Qualifier: Diagnosis of  By: Norleen MD, Lynwood ORN    Hematochezia  03/17/2008    Formatting of this note might be different from the original. Qualifier: Diagnosis of  By: Norleen MD, Lynwood ORN    Abdominal pain, other specified site 03/17/2008    Formatting of this note might be different from the original. Qualifier: Diagnosis of  By: Norleen MD, Lynwood ORN    Personal history of unspecified circulatory disease 11/22/2007    Qualifier: Diagnosis of  By: Norleen MD, Lynwood ORN IMO 10/01 Updates    Allergic rhinitis 11/22/2007    Formatting of this note might be different from the original. Qualifier: Diagnosis of  By: Norleen MD, Lynwood ORN    Depression 11/22/2007    Formatting of this note might be different from the original. Qualifier: Diagnosis of  By: Norleen MD, Lynwood ORN    Hyperlipidemia 11/22/2007    Formatting of this note might be different from the original. Qualifier: Diagnosis of  By: Norleen MD, Lynwood ORN    Nonspecific abnormal electrocardiogram (ECG) (EKG) 11/22/2007    Formatting of this note might be different from the original. Qualifier: Diagnosis of  By: Norleen MD, Lynwood ORN    Personal history of unspecified circulatory disease 11/22/2007    Formatting of this note might be different from the original. Qualifier: Diagnosis of  By: Norleen MD, Lynwood ORN     No results found for: [facility]   Chemistry      Component Value Date/Time   NA 140 11/03/2019 1453   K 5.3 (H) 11/03/2019 1453   CL 103 11/03/2019 1453   CO2 24 11/03/2019 1453   BUN 22 11/03/2019 1453   CREATININE 1.18 11/03/2019 1453   GLUCOSE 95 11/03/2019 1453      Component Value Date/Time   CALCIUM  9.7 11/03/2019 1453   ALKPHOS 92 11/03/2019 1453   AST 19 11/03/2019 1453   ALT 14 11/03/2019 1453   BILITOT 0.2 11/03/2019 1453      No results found for: CHOL No results found for: HDL No results found for: LDL No results found for: TRIG No results found for: CHOLHDL No results found for: CPUI74 Lab Results  Component Value Date   Vitamin B-12 572 11/03/2019   Lab  Results  Component Value Date   Folate >20.0 11/03/2019    No results found for this or any previous visit. . Lab Results  Component Value Date   WBC 7.6 11/03/2019   Hemoglobin 12.1 (L) 11/03/2019   Hematocrit 37.5 11/03/2019   MCV 87 11/03/2019   Platelet Count 315 11/03/2019    Health Maintenance Summary   Patient has no health maintenance due at this time     Review of Systems  ROS  Past Medical History:  Diagnosis Date   Hypertension    Past Surgical History:  Procedure Laterality Date   Sleeve gastroplasty     Total hip arthroplasty Bilateral    Social History   Socioeconomic History   Marital status: Divorced    Spouse name: Not on file   Number of children: Not on file   Years of education: Not on file   Highest education level: Not on file  Occupational History   Not on file  Tobacco Use   Smoking status: Never Smoker   Smokeless tobacco: Never Used  Substance and Sexual Activity   Alcohol use: Yes   Drug use: Never   Sexual activity: Not on file  Other Topics Concern   Not on file  Social History Narrative   Not on file   Social Determinants of Health   Financial Resource Strain:    Difficulty of Paying Living Expenses:   Food Insecurity:    Worried About Programme Researcher, Broadcasting/film/video in the Last Year:    Barista in the Last Year:   Transportation Needs:    Freight Forwarder (Medical):    Lack of Transportation (Non-Medical):   Physical Activity:    Days of Exercise per Week:    Minutes of Exercise per Session:   Stress:    Feeling of Stress :   Social Connections:    Frequency  of Communication with Friends and Family:    Frequency of Social Gatherings with Friends and Family:    Attends Religious Services:    Active Member of Clubs or Organizations:    Attends Engineer, Structural:    Marital Status:   Intimate Partner Violence:    Fear of Current or Ex-Partner:    Emotionally Abused:     Physically Abused:    Sexually Abused:    Family History  Problem Relation Age of Onset   Cancer Mother    Cancer Father    Allergies  Allergen Reactions   Bee Venom Swelling   Penicillins Other    Pt passes out    Current Outpatient Medications on File Prior to Visit  Medication Sig Dispense Refill   amLODIPine besylate (NORVASC) 5 mg tablet Take 5 mg by mouth daily.     aspirin (ASPRI-LOW,ECOTRIN LOW DOSE) 81 mg EC tablet Take 162 mg by mouth.     cyclobenzaprine  (FLEXERIL ) 10 mg tablet      docusate sodium  (COLACE,DOK,DOCQLACE) capsule Take 100 mg by mouth.      gabapentin  (NEURONTIN ) 300 mg capsule      hydrochlorothiazide (HYDRODIURIL) 12.5 mg tablet      ibuprofen (ADVIL,MOTRIN) 800 mg tablet      ketoconazole (NIZORAL) 2 % cream      Loratadine (CLARITIN PO) Take by mouth.     methocarbamol  (ROBAXIN ) 500 MG tablet Take 500 mg by mouth.     Multiple Vitamins-Minerals (CENTRUM MULTIGUMMIES PO) Take by mouth.     omeprazole (PRILOSEC) 20 mg capsule Take 20 mg by mouth.     oxyCODONE -acetaminophen  (PERCOCET,ENDOCET) 5-325 mg per tablet      gabapentin  (NEURONTIN ) 100 mg capsule  (Patient not taking: Reported on 12/10/2019)     NARCAN 4 MG/0.1ML LIQD nasal spray  (Patient not taking: Reported on 12/10/2019)     sildenafil citrate (VIAGRA) 100 mg tablet Take one tablet (100 mg dose) by mouth as needed for Erectile Dysfunction. (Patient not taking: Reported on 12/10/2019) 30 tablet 1   No current facility-administered medications on file prior to visit.     Objective   BP 138/82   Pulse 90   Ht 5' 6 (1.676 m)   Wt 285 lb (129.3 kg)   SpO2 99%   BMI 46.00 kg/m  Recent Review Flowsheet Data    Abdominal Girth  12/10/2019   Abdominal Girth inches (No Data)    Abdominal Girth cm (No Data)       Physical Exam Constitutional:      Appearance: He is well-developed.     Comments: Morbidly Obese  Neck:     Thyroid: No thyromegaly.     Trachea:  No tracheal deviation.  Cardiovascular:     Rate and Rhythm: Normal rate and regular rhythm.     Heart sounds: Normal heart sounds.  Musculoskeletal:     Cervical back: Neck supple.  Pulmonary:     Effort: Pulmonary effort is normal.     Breath sounds: Normal breath sounds. No wheezing or rales.  Abdominal:     General: Bowel sounds are normal. There is no distension.     Palpations: Abdomen is soft.     Tenderness: There is no abdominal tenderness.  Skin:    General: Skin is warm and dry.  Neurological:     Mental Status: He is alert.  Psychiatric:        Behavior: Behavior normal.     No results  found for: HGBA1C No results found for: LDL Wt Readings from Last 5 Encounters:  12/10/19 285 lb (129.3 kg)  11/03/19 284 lb 12.8 oz (129.2 kg)  09/01/19 278 lb (126.1 kg)  08/18/19 273 lb (123.8 kg)  02/17/19 260 lb (117.9 kg)   Recent Review Flowsheet Data    Abdominal Girth  12/10/2019   Abdominal Girth inches (No Data)    Abdominal Girth cm (No Data)       Body mass index is 46 kg/m.    Chemistry      Component Value Date/Time   NA 140 11/03/2019 1453   K 5.3 (H) 11/03/2019 1453   CL 103 11/03/2019 1453   CO2 24 11/03/2019 1453   BUN 22 11/03/2019 1453   CREATININE 1.18 11/03/2019 1453   GLUCOSE 95 11/03/2019 1453      Component Value Date/Time   CALCIUM  9.7 11/03/2019 1453   ALKPHOS 92 11/03/2019 1453   AST 19 11/03/2019 1453   ALT 14 11/03/2019 1453   BILITOT 0.2 11/03/2019 1453      No results found for: TSH No results found for: HGBA1C No components found for: CHEM   @LASTINSULIN (insulin )@ No components found for: INS  No results found for: CHOL No results found for: HDL No results found for: LDL No results found for: TRIG  @NHVITD25HYDROXY (72h)@  Lab Results  Component Value Date   WBC 7.6 11/03/2019   Hemoglobin 12.1 (L) 11/03/2019   Hematocrit 37.5 11/03/2019   MCV 87 11/03/2019   Platelet Count 315 11/03/2019   Lab Results   Component Value Date   Vitamin B-12 572 11/03/2019   Lab Results  Component Value Date   Folate >20.0 11/03/2019   Lab Results  Component Value Date   Ferritin 69 11/03/2019       Impression   1. Morbid obesity with BMI of 45.0-49.9, adult (*)    2. Hyperlipidemia, unspecified hyperlipidemia type    3. Essential hypertension       Plan  Mr. Tavella is here for initial visit for medical/ surgical weight loss.  Given His BMI is greater than 35 with obesity related comorbidities/greater than 40, He is a potential candidate for weight loss surgery.  After discussion of treatment options, He is not interested in bariatric surgery.His current goal is to lose 85 pounds, have more energy, and improve His health.  . Labs are UTD. VSS. Benefits form checked and reviewed with patient. Reviewed old records.   Contributing factors to obesity: lack of activity  Nutrition: Food packet and portion size guide discussed and handout given. Discussed how moderation of carbohydrates, along with increased protein and non starchy vegetables aids in weight loss.  Will maintain water  intake to at least 64+ ounces daily. Will have a protein source at each meal.   Activity: Recommended exercise. I discussed with the patient that a combination of both cardio and resistance training works best for both losing weight and for maintaining weight when one has reached a goal weight.  Consider using [email or web address].com, Lose It, Spark People or the Chronometer to log nutrition and physical activity.   Medications: gabapentin  Phentermine is contraindicated secondary to heart disease. Discussed criteria for continuation of medication, being 5 % of baseline body weight in 3 months. Beryl Vonn [name]. verbalized understanding.   Referrals: Nutrition   Consent has been signed.  Followup in 6 week(s).   Discussed with the patient and all questioned fully answered. He will call me if any problems arise.  I have  reviewed the information contained in this note and personally verified its accuracy.  I obtained the history of present illness and personally performed the physical exam  Note: This document was generated using voice recognition software. There may be unintended transcription errors that were not detected upon document review.   Hypertension, patient taking meds as prescribed, being managed by PCP.  Plan for Morbid Obesity with BMI of 46 is as follows:  Try not to eat 3 hours before bedtime.   Eliminate sugar beverages including juice, sweet tea and sodas.    Drink more water , 80 oz per day is a good goal.   Exercise at least 5 times a week for 30 minutes at your target heart rate.   Add resistance training to your exercise plan.      Consider low impact options elliptical machine, recumbent bike, yoga, pilates and water  aerobics.     Keep track of all food intake and exercise with Cronometer, My Fitness Pal, Spark people, Lose It or Baritastic app.  You can also use pen and paper to track daily food consumption.      Follow suggestions in the low carb, high protein dietary hand out.    Don't skip meals, try a protein shake instead.   Follow up in 8 weeks.     Weight Loss Tips - Healthy Eating Eat 3 meals per day. Do not skip meals. Consider having a protein shake as a meal replacement to aid with eliminating meal skipping. Look for products with <220 calories, <7 gm sugar, and 20-30 gm protein. Eat breakfast within 2 hours of getting up.  Make  your plate non-starchy vegetables,  protein, and  carbohydrates at lunch and dinner.  Aim for at least 64 oz. of calorie-free beverages daily (water , Crystal Light, diet green tea, etc.). Eliminate any sugary beverages such as regular soda, sweet tea, or fruit juice.  Pay attention to hunger and fullness cues.  Stop eating once you feel satisfied; dont wait until you feel full, stuffed, or sick from eating. Choose lean meats and low  fat/fat free dairy products. Choose foods high in fiber such as fruits, vegetables, and whole grains (brown rice, whole wheat pasta, whole wheat bread, etc.). Limit foods with added sugar to <7 gm per serving. Always eat in the kitchen/dining room.  Never eat in the bedroom or in front of the TV

## 2019-12-25 NOTE — Progress Notes (Signed)
 Telephone call to pt for [facility] Coordinates dietitian referral. LM with contact information and availability requesting a return call to dietitian.    I have been unable to reach this pt by phone for [facility] Coord[facility] dietitian referral. A letter will be sent to the pt via MyChart. I am closing this referral but it can be reopened if contact is made with pt. Pt can contact dietitian directly to schedule if interested via MyChart or [phone]

## 2020-01-12 NOTE — Progress Notes (Signed)
 Reviewed office visit note by nurse practitioner and agree with assessment and plan of care.

## 2020-02-17 NOTE — Progress Notes (Signed)
 Office Note  02/17/2020  Visit Problem List:  1. Syncope and collapse    2. Status post placement of implantable loop recorder    3. Syncope, unspecified syncope type    4. Chest pain, unspecified type    5. Essential hypertension    6. Dizziness    7. Edema, unspecified type    8. Sinus bradycardia       Chronic Problem List:  Patient Active Problem List  Diagnosis   Chest pain   Essential hypertension   Edema   Personal history of malignant neoplasm of large intestine   Personal history of unspecified circulatory disease   Sinus bradycardia   RBBB (right bundle branch block with left anterior fascicular block)   Status post placement of implantable loop recorder   BMI 40.0-44.9, adult (*)   History of bilateral hip replacements   Personal history of spine surgery   Chronic bilateral low back pain without sciatica   Chronic pain of both hips   Abdominal tenderness, epigastric   Allergic rhinitis   Depression   Diarrhea of presumed infectious origin   Hematochezia   Hemorrhage of rectum and anus   Hyperlipidemia   Nonspecific abnormal electrocardiogram (ECG) (EKG)   Abdominal pain, other specified site   Back pain   Hip pain, bilateral   Hx of total hip arthroplasty, bilateral   Personal history of unspecified circulatory disease   History of colon cancer   Personal history of malignant neoplasm of unspecified site in gastrointestinal tract   Grade I internal hemorrhoids   Normocytic anemia   History of sleeve gastrectomy     Medications:  Current Outpatient Medications:    amLODIPine besylate (NORVASC) 5 mg tablet, Take 5 mg by mouth daily., Disp: , Rfl:    aspirin (ASPRI-LOW,ECOTRIN LOW DOSE) 81 mg EC tablet, Take 162 mg by mouth., Disp: , Rfl:    cyclobenzaprine  (FLEXERIL ) 10 mg tablet, , Disp: , Rfl:    docusate sodium  (COLACE,DOK,DOCQLACE) capsule, Take 100  mg by mouth. , Disp: , Rfl:    gabapentin  (NEURONTIN ) 100 mg capsule, , Disp: , Rfl:    gabapentin  (NEURONTIN ) 300 mg capsule, , Disp: , Rfl:    hydrochlorothiazide (HYDRODIURIL) 12.5 mg tablet, , Disp: , Rfl:    ibuprofen (ADVIL,MOTRIN) 800 mg tablet, , Disp: , Rfl:    ketoconazole (NIZORAL) 2 % cream, , Disp: , Rfl:    Loratadine (CLARITIN PO), Take by mouth., Disp: , Rfl:    methocarbamol  (ROBAXIN ) 500 MG tablet, Take 500 mg by mouth., Disp: , Rfl:    Multiple Vitamins-Minerals (CENTRUM MULTIGUMMIES PO), Take by mouth., Disp: , Rfl:    NARCAN 4 MG/0.1ML LIQD nasal spray, , Disp: , Rfl:    omeprazole (PRILOSEC) 20 mg capsule, Take 20 mg by mouth., Disp: , Rfl:    oxyCODONE -acetaminophen  (PERCOCET,ENDOCET) 5-325 mg per tablet, , Disp: , Rfl:    sildenafil citrate (VIAGRA) 100 mg tablet, Take one tablet (100 mg dose) by mouth as needed for Erectile Dysfunction., Disp: 30 tablet, Rfl: 1  Interval History: Justin Cain. returns in follow-up today his syncope, sinus bradycardia and loop recorder implantation.  His loop recorder tracings were reviewed and he has had no significant arrhythmia since device implantation.  Further he denies any symptoms since device implantation.  Continues to struggle with his bilateral hip pain.  ROS General - weight stable, appetite normal; no fever HEENT- vision stable, hearing normal; no difficulty swallowing Endocrine - thyroid stable Resp -  denies cough, hemoptysis, purulent sputum Abdomen - denies belly pain, swelling; denies liver disease or jaundice GI - no hematemesis, melena. Normal bowel movements. No Crohns/UC history GU - no dysuria, hematuria, pyuria. No UTI or kidney stone. Neuro - no slurred speech, blurred vision; denies amaurosis or sided weakness Skin - no rash, bruising, petechia Hematologic - no recent bleeding or easy bruising Allergy - denies food allergy; drug allergies as noted Psych - No depression, normal  sleep  Physical Examination Vitals:   02/17/20 1525  BP: 130/72  Pulse: 68  Resp: 16  SpO2: 100%  Weight: 296 lb (134.3 kg)  Height: 5' 6 (1.676 m)   General - Pt. in NAD, normal demeanor, speech normal, oriented normally HEENT - PERRLA, EOMs intact, nares and throat clear Neck - carotid upstrokes normal, bruits not present. Thyroid nonpalpable. No palpable nodes. Lungs - auscultation reveals clear breath sounds Heart -regular rhythm, normal S1, S2, no gallop, no murmur is present Abdomen - soft, bowel sounds positive, no organomegaly Extrem -no edema is noted, no cyanosis, clubbing is seen Skin - no rash, bruising or petechia Back - no CVA tenderness Neuro - strength is symmetric in upper and lower extremities; reflexes normal, speech normal, face symmetric Psych - affect appropriate  Post-visit Impression:  Sinus bradycardia History of syncope Right bundle branch block Obesity Status post loop recorder with no arrhythmias recorded  Recommendations:  Continue to monitor and return to clinic yearly

## 2021-04-14 NOTE — Progress Notes (Signed)
 Office Note  04/14/2021  Visit Problem List:  1. Syncope, unspecified syncope type      2. Status post placement of implantable loop recorder      3. Edema, unspecified type      4. Essential hypertension      5. RBBB (right bundle branch block with left anterior fascicular block)      6. Sinus bradycardia         Chronic Problem List:  Patient Active Problem List  Diagnosis   Chest pain   Essential hypertension   Edema   Personal history of malignant neoplasm of large intestine   Personal history of unspecified circulatory disease   Sinus bradycardia   RBBB (right bundle branch block with left anterior fascicular block)   Status post placement of implantable loop recorder   BMI 40.0-44.9, adult (*)   History of bilateral hip replacements   Personal history of spine surgery   Chronic bilateral low back pain without sciatica   Chronic pain of both hips   Abdominal tenderness, epigastric   Allergic rhinitis   Depression   Diarrhea of presumed infectious origin   Hematochezia   Hemorrhage of rectum and anus   Hyperlipidemia   Nonspecific abnormal electrocardiogram (ECG) (EKG)   Abdominal pain, other specified site   Back pain   Hip pain, bilateral   Hx of total hip arthroplasty, bilateral   Personal history of unspecified circulatory disease   History of colon cancer   Personal history of malignant neoplasm of unspecified site in gastrointestinal tract   Grade I internal hemorrhoids   Normocytic anemia   History of sleeve gastrectomy     Medications:  Current Outpatient Medications:    amLODIPine besylate (NORVASC) 5 mg tablet, Take one tablet (5 mg dose) by mouth daily., Disp: , Rfl:    aspirin (ASPRI-LOW,ECOTRIN LOW DOSE) 81 mg EC tablet, Take two tablets (162 mg dose) by mouth., Disp: , Rfl:    cyclobenzaprine  (FLEXERIL ) 10 mg tablet, , Disp: , Rfl:     docusate sodium  (COLACE,DOK,DOCQLACE) capsule, Take one capsule (100 mg dose) by mouth., Disp: , Rfl:    gabapentin  (NEURONTIN ) 100 mg capsule, , Disp: , Rfl:    gabapentin  (NEURONTIN ) 300 mg capsule, , Disp: , Rfl:    hydrochlorothiazide (HYDRODIURIL) 12.5 mg tablet, , Disp: , Rfl:    ibuprofen (ADVIL,MOTRIN) 800 mg tablet, , Disp: , Rfl:    ketoconazole (NIZORAL) 2 % cream, , Disp: , Rfl:    Loratadine (CLARITIN PO), Take by mouth., Disp: , Rfl:    methocarbamol  (ROBAXIN ) 500 MG tablet, Take one tablet (500 mg dose) by mouth., Disp: , Rfl:    Multiple Vitamins-Minerals (CENTRUM MULTIGUMMIES PO), Take by mouth., Disp: , Rfl:    NARCAN 4 MG/0.1ML LIQD nasal spray, , Disp: , Rfl:    omeprazole (PRILOSEC) 20 mg capsule, Take one capsule (20 mg dose) by mouth., Disp: , Rfl:    oxyCODONE -acetaminophen  (PERCOCET,ENDOCET) 5-325 mg per tablet, , Disp: , Rfl:    sildenafil citrate (VIAGRA) 100 mg tablet, Take one tablet (100 mg dose) by mouth as needed for Erectile Dysfunction., Disp: 30 tablet, Rfl: 1  Interval History: Justin Cain. returns in follow-up today history of syncope.  His loop recorder device has never documented a specific arrhythmia.  He has had no further syncopal episodes.  ROS General - weight stable, appetite normal; no fever HEENT- vision stable, hearing normal; no difficulty swallowing Endocrine - thyroid stable Resp - denies  cough, hemoptysis, purulent sputum Abdomen - denies belly pain, swelling; denies liver disease or jaundice GI - no hematemesis, melena. Normal bowel movements. No Crohns/UC history GU - no dysuria, hematuria, pyuria. No UTI or kidney stone. Neuro - no slurred speech, blurred vision; denies amaurosis or sided weakness Skin - no rash, bruising, petechia Hematologic - no recent bleeding or easy bruising Allergy - denies food allergy; drug allergies as noted Psych - No depression, normal sleep  Physical Examination Vitals:    04/14/21 1538  BP: 138/82  BP Location: Left arm  Patient Position: Sitting  Pulse: 82  SpO2: 96%  Weight: 291 lb (132 kg)  Height: 5' 6 (1.676 m)   General - Pt. in NAD, normal demeanor, speech normal, oriented normally HEENT - PERRLA, EOMs intact, nares and throat clear Neck - carotid upstrokes normal, bruits not present. Thyroid nonpalpable. No palpable nodes. Lungs - auscultation reveals clear breath sounds Heart -regular rhythm, normal S1, S2, no gallop, no murmur is present Abdomen - soft, bowel sounds positive, no organomegaly Extrem -no edema is noted, no cyanosis, clubbing is seen Skin - no rash, bruising or petechia Back - no CVA tenderness Neuro - strength is symmetric in upper and lower extremities; reflexes normal, speech normal, face symmetric Psych - affect appropriate  Post-visit Impression:  Syncope, no recent episodes Loop recorder implant, no arrhythmias noted Hypertension Right bundle branch block, left anterior fascicular block   Recommendations:  Return to clinic yearly and we will continue to follow his loop recorder tracings

## 2021-10-04 ENCOUNTER — Other Ambulatory Visit (HOSPITAL_COMMUNITY): Payer: Self-pay | Admitting: Family Medicine

## 2023-12-21 ENCOUNTER — Inpatient Hospital Stay (HOSPITAL_COMMUNITY)
Admission: EM | Admit: 2023-12-21 | Discharge: 2023-12-24 | DRG: 683 | Disposition: A | Payer: Self-pay | Source: Ambulatory Visit | Attending: Student | Admitting: Student

## 2023-12-21 ENCOUNTER — Emergency Department (HOSPITAL_COMMUNITY): Payer: Self-pay

## 2023-12-21 DIAGNOSIS — Z8546 Personal history of malignant neoplasm of prostate: Secondary | ICD-10-CM | POA: Diagnosis not present

## 2023-12-21 DIAGNOSIS — N179 Acute kidney failure, unspecified: Secondary | ICD-10-CM | POA: Diagnosis present

## 2023-12-21 DIAGNOSIS — I129 Hypertensive chronic kidney disease with stage 1 through stage 4 chronic kidney disease, or unspecified chronic kidney disease: Secondary | ICD-10-CM | POA: Diagnosis present

## 2023-12-21 DIAGNOSIS — E875 Hyperkalemia: Secondary | ICD-10-CM | POA: Diagnosis present

## 2023-12-21 DIAGNOSIS — Z79899 Other long term (current) drug therapy: Secondary | ICD-10-CM | POA: Diagnosis not present

## 2023-12-21 DIAGNOSIS — Z7982 Long term (current) use of aspirin: Secondary | ICD-10-CM | POA: Diagnosis not present

## 2023-12-21 DIAGNOSIS — Z6841 Body Mass Index (BMI) 40.0 and over, adult: Secondary | ICD-10-CM | POA: Diagnosis not present

## 2023-12-21 DIAGNOSIS — K219 Gastro-esophageal reflux disease without esophagitis: Secondary | ICD-10-CM | POA: Diagnosis present

## 2023-12-21 DIAGNOSIS — E785 Hyperlipidemia, unspecified: Secondary | ICD-10-CM | POA: Diagnosis present

## 2023-12-21 DIAGNOSIS — Z85038 Personal history of other malignant neoplasm of large intestine: Secondary | ICD-10-CM | POA: Diagnosis not present

## 2023-12-21 DIAGNOSIS — Z91030 Bee allergy status: Secondary | ICD-10-CM | POA: Diagnosis not present

## 2023-12-21 DIAGNOSIS — E872 Acidosis, unspecified: Secondary | ICD-10-CM | POA: Diagnosis present

## 2023-12-21 DIAGNOSIS — Z880 Allergy status to penicillin: Secondary | ICD-10-CM | POA: Diagnosis not present

## 2023-12-21 DIAGNOSIS — E8729 Other acidosis: Secondary | ICD-10-CM | POA: Diagnosis not present

## 2023-12-21 DIAGNOSIS — Z886 Allergy status to analgesic agent status: Secondary | ICD-10-CM | POA: Diagnosis not present

## 2023-12-21 DIAGNOSIS — Z96643 Presence of artificial hip joint, bilateral: Secondary | ICD-10-CM | POA: Diagnosis present

## 2023-12-21 DIAGNOSIS — M48061 Spinal stenosis, lumbar region without neurogenic claudication: Secondary | ICD-10-CM | POA: Diagnosis present

## 2023-12-21 DIAGNOSIS — N1832 Chronic kidney disease, stage 3b: Secondary | ICD-10-CM | POA: Diagnosis present

## 2023-12-21 DIAGNOSIS — F39 Unspecified mood [affective] disorder: Secondary | ICD-10-CM | POA: Diagnosis present

## 2023-12-21 DIAGNOSIS — G8929 Other chronic pain: Secondary | ICD-10-CM | POA: Diagnosis present

## 2023-12-21 LAB — COMPREHENSIVE METABOLIC PANEL WITH GFR
ALT: 19 U/L (ref 0–44)
AST: 31 U/L (ref 15–41)
Albumin: 4.5 g/dL (ref 3.5–5.0)
Alkaline Phosphatase: 61 U/L (ref 38–126)
Anion gap: 10 (ref 5–15)
BUN: 40 mg/dL — ABNORMAL HIGH (ref 8–23)
CO2: 21 mmol/L — ABNORMAL LOW (ref 22–32)
Calcium: 10.5 mg/dL — ABNORMAL HIGH (ref 8.9–10.3)
Chloride: 105 mmol/L (ref 98–111)
Creatinine, Ser: 2.33 mg/dL — ABNORMAL HIGH (ref 0.61–1.24)
GFR, Estimated: 30 mL/min — ABNORMAL LOW (ref >60)
Glucose, Bld: 109 mg/dL — ABNORMAL HIGH (ref 70–99)
Potassium: 7.4 mmol/L (ref 3.5–5.1)
Sodium: 136 mmol/L (ref 135–145)
Total Bilirubin: 0.5 mg/dL (ref 0.0–1.2)
Total Protein: 8.1 g/dL (ref 6.5–8.1)

## 2023-12-21 LAB — I-STAT CHEM 8, ED
BUN: 47 mg/dL — ABNORMAL HIGH (ref 8–23)
Calcium, Ion: 1.28 mmol/L (ref 1.15–1.40)
Chloride: 111 mmol/L (ref 98–111)
Creatinine, Ser: 2.5 mg/dL — ABNORMAL HIGH (ref 0.61–1.24)
Glucose, Bld: 56 mg/dL — ABNORMAL LOW (ref 70–99)
HCT: 37 % — ABNORMAL LOW (ref 39.0–52.0)
Hemoglobin: 12.6 g/dL — ABNORMAL LOW (ref 13.0–17.0)
Potassium: 6.3 mmol/L (ref 3.5–5.1)
Sodium: 139 mmol/L (ref 135–145)
TCO2: 21 mmol/L — ABNORMAL LOW (ref 22–32)

## 2023-12-21 LAB — CBC
HCT: 38.7 % — ABNORMAL LOW (ref 39.0–52.0)
Hemoglobin: 11.6 g/dL — ABNORMAL LOW (ref 13.0–17.0)
MCH: 27.8 pg (ref 26.0–34.0)
MCHC: 30 g/dL (ref 30.0–36.0)
MCV: 92.8 fL (ref 80.0–100.0)
Platelets: 244 K/uL (ref 150–400)
RBC: 4.17 MIL/uL — ABNORMAL LOW (ref 4.22–5.81)
RDW: 14 % (ref 11.5–15.5)
WBC: 9.2 K/uL (ref 4.0–10.5)
nRBC: 0 % (ref 0.0–0.2)

## 2023-12-21 LAB — I-STAT CG4 LACTIC ACID, ED: Lactic Acid, Venous: 0.9 mmol/L (ref 0.5–1.9)

## 2023-12-21 MED ORDER — CALCIUM GLUCONATE-NACL 1-0.675 GM/50ML-% IV SOLN
1.0000 g | Freq: Once | INTRAVENOUS | Status: AC
  Administered 2023-12-21: 1000 mg via INTRAVENOUS
  Filled 2023-12-21: qty 50

## 2023-12-21 MED ORDER — ONDANSETRON HCL 4 MG/2ML IJ SOLN: 4.0000 mg | Freq: Four times a day (QID) | INTRAMUSCULAR | Status: DC | PRN

## 2023-12-21 MED ORDER — INSULIN ASPART 100 UNIT/ML IV SOLN
5.0000 [IU] | Freq: Once | INTRAVENOUS | Status: AC
  Administered 2023-12-21: 5 [IU] via INTRAVENOUS
  Filled 2023-12-21: qty 5

## 2023-12-21 MED ORDER — BUPROPION HCL ER (XL) 300 MG PO TB24
300.0000 mg | ORAL_TABLET | Freq: Every day | ORAL | Status: DC
  Administered 2023-12-22 – 2023-12-24 (×3): 300 mg via ORAL
  Filled 2023-12-21: qty 2
  Filled 2023-12-21 (×2): qty 1

## 2023-12-21 MED ORDER — VITAMIN D (ERGOCALCIFEROL) 1.25 MG (50000 UNIT) PO CAPS
50000.0000 [IU] | ORAL_CAPSULE | ORAL | Status: DC
  Filled 2023-12-21 (×2): qty 1

## 2023-12-21 MED ORDER — ONDANSETRON HCL 4 MG PO TABS: 4.0000 mg | ORAL_TABLET | Freq: Four times a day (QID) | ORAL | Status: DC | PRN

## 2023-12-21 MED ORDER — CYCLOBENZAPRINE HCL 10 MG PO TABS
10.0000 mg | ORAL_TABLET | Freq: Three times a day (TID) | ORAL | Status: DC | PRN
  Administered 2023-12-23 (×2): 10 mg via ORAL
  Filled 2023-12-21 (×2): qty 1

## 2023-12-21 MED ORDER — LACTATED RINGERS IV SOLN: INTRAVENOUS | Status: DC

## 2023-12-21 MED ORDER — SODIUM ZIRCONIUM CYCLOSILICATE 10 G PO PACK
10.0000 g | PACK | Freq: Once | ORAL | Status: AC
  Administered 2023-12-21: 10 g via ORAL
  Filled 2023-12-21: qty 1

## 2023-12-21 MED ORDER — ALBUTEROL SULFATE (2.5 MG/3ML) 0.083% IN NEBU
10.0000 mg | INHALATION_SOLUTION | Freq: Once | RESPIRATORY_TRACT | Status: AC
  Administered 2023-12-21: 10 mg via RESPIRATORY_TRACT
  Filled 2023-12-21: qty 12

## 2023-12-21 MED ORDER — HEPARIN SODIUM (PORCINE) 5000 UNIT/ML IJ SOLN
5000.0000 [IU] | Freq: Three times a day (TID) | INTRAMUSCULAR | Status: DC
  Administered 2023-12-22 – 2023-12-24 (×7): 5000 [IU] via SUBCUTANEOUS
  Filled 2023-12-21 (×7): qty 1

## 2023-12-21 MED ORDER — ALBUTEROL SULFATE (2.5 MG/3ML) 0.083% IN NEBU: 2.5000 mg | INHALATION_SOLUTION | RESPIRATORY_TRACT | Status: DC | PRN

## 2023-12-21 MED ORDER — FERROUS SULFATE 325 (65 FE) MG PO TABS
324.0000 mg | ORAL_TABLET | Freq: Every day | ORAL | Status: DC
  Administered 2023-12-22 – 2023-12-24 (×3): 324 mg via ORAL
  Filled 2023-12-21 (×3): qty 1

## 2023-12-21 MED ORDER — TRAMADOL HCL 50 MG PO TABS: 50.0000 mg | ORAL_TABLET | Freq: Three times a day (TID) | ORAL | Status: DC | PRN

## 2023-12-21 MED ORDER — DOCUSATE SODIUM 100 MG PO CAPS
100.0000 mg | ORAL_CAPSULE | Freq: Two times a day (BID) | ORAL | Status: DC
  Administered 2023-12-22 – 2023-12-24 (×5): 100 mg via ORAL
  Filled 2023-12-21 (×5): qty 1

## 2023-12-21 MED ORDER — DEXTROSE 50 % IV SOLN
1.0000 | Freq: Once | INTRAVENOUS | Status: AC
  Administered 2023-12-21: 50 mL via INTRAVENOUS
  Filled 2023-12-21: qty 50

## 2023-12-21 MED ORDER — FAMOTIDINE 20 MG PO TABS
20.0000 mg | ORAL_TABLET | Freq: Every day | ORAL | Status: DC
  Administered 2023-12-22 – 2023-12-24 (×3): 20 mg via ORAL
  Filled 2023-12-21 (×3): qty 1

## 2023-12-21 MED ORDER — ROSUVASTATIN CALCIUM 10 MG PO TABS
20.0000 mg | ORAL_TABLET | Freq: Every day | ORAL | Status: DC
  Administered 2023-12-22 – 2023-12-24 (×3): 20 mg via ORAL
  Filled 2023-12-21: qty 2
  Filled 2023-12-21: qty 1
  Filled 2023-12-21: qty 2

## 2023-12-21 MED ORDER — SODIUM ZIRCONIUM CYCLOSILICATE 10 G PO PACK
10.0000 g | PACK | Freq: Two times a day (BID) | ORAL | Status: DC
  Filled 2023-12-21 (×2): qty 1

## 2023-12-21 MED ORDER — SODIUM CHLORIDE 0.9 % IV SOLN: INTRAVENOUS | Status: DC

## 2023-12-21 NOTE — ED Triage Notes (Signed)
 Patient in today reporting hypotension from [facility] with 90/60s currently on narcotic pain meds and muscle relaxants. Hx of syncope.

## 2023-12-21 NOTE — ED Provider Notes (Addendum)
 [place] EMERGENCY DEPARTMENT AT [facility] Provider Note   CSN: [id number] Arrival date & time: 12/21/23  1653     Patient presents with: Hypotension   Justin Ogas. is a 68 y.o. male.   68 year old male presents with hypertension for [facility].  Patient has history of bilateral hip replacements and uses a cane to ambulate.  Was therefore he was normal pain medication refill.  Denies any new vomiting or fever.  Otherwise feels okay.  Blood pressure there was reported to be 90/60.  Denied any chest or abdominal discomfort.  Has not been short of breath.  Patient sent here for further evaluation.       Prior to Admission medications  Medication Sig Start Date End Date Taking? Authorizing Provider  aspirin EC 325 MG tablet Take 325 mg by mouth daily.    [provider]  docusate sodium (COLACE) 100 MG capsule Take 100 mg by mouth 2 (two) times daily.     [provider]  methocarbamol  (ROBAXIN ) 500 MG tablet Take 500 mg by mouth every 6 (six) hours as needed for muscle spasms.    [provider]  omeprazole (PRILOSEC) 20 MG capsule Take 20 mg by mouth every morning.     [provider]  oxycodone  (OXY-IR) 5 MG capsule Take 1 capsule (5 mg total) by mouth every 3 (three) hours as needed. 1 tablet for moderate pain, 2 tablets for severe pain 11/08/15   Eubanks, Jessica K, NP  Protein (PROCEL) POWD Take 1 scoop by mouth 2 (two) times daily.    [provider]  ranitidine (ZANTAC) 150 MG tablet Take 150 mg by mouth every morning.     [provider]    Allergies: Bee venom, Nsaids, and Penicillins    Review of Systems  All other systems reviewed and are negative.   Updated Vital Signs BP 126/78 (BP Location: Right Arm)   Pulse 88   Temp 98.7 F (37.1 C) (Oral)   Resp 16   SpO2 100%   Physical Exam Vitals and nursing note reviewed.  Constitutional:      General: He is not in acute  distress.    Appearance: Normal appearance. He is well-developed. He is not toxic-appearing.  HENT:     Head: Normocephalic and atraumatic.  Eyes:     General: Lids are normal.     Conjunctiva/sclera: Conjunctivae normal.     Pupils: Pupils are equal, round, and reactive to light.  Neck:     Thyroid: No thyroid mass.     Trachea: No tracheal deviation.  Cardiovascular:     Rate and Rhythm: Normal rate and regular rhythm.     Heart sounds: Normal heart sounds. No murmur heard.    No gallop.  Pulmonary:     Effort: Pulmonary effort is normal. No respiratory distress.     Breath sounds: Normal breath sounds. No stridor. No decreased breath sounds, wheezing, rhonchi or rales.  Abdominal:     General: There is no distension.     Palpations: Abdomen is soft.     Tenderness: There is no abdominal tenderness. There is no rebound.  Musculoskeletal:        General: No tenderness. Normal range of motion.     Cervical back: Normal range of motion and neck supple.  Skin:    General: Skin is warm and dry.     Findings: No abrasion or rash.  Neurological:     Mental  Status: He is alert and oriented to person, place, and time. Mental status is at baseline.     GCS: GCS eye subscore is 4. GCS verbal subscore is 5. GCS motor subscore is 6.     Cranial Nerves: No cranial nerve deficit.     Sensory: No sensory deficit.     Motor: Motor function is intact.  Psychiatric:        Attention and Perception: Attention normal.        Speech: Speech normal.        Behavior: Behavior normal.     (all labs ordered are listed, but only abnormal results are displayed) Labs Reviewed  CBC - Abnormal; Notable for the following components:      Result Value   RBC 4.17 (*)    Hemoglobin 11.6 (*)    HCT 38.7 (*)    All other components within normal limits  COMPREHENSIVE METABOLIC PANEL WITH GFR - Abnormal; Notable for the following components:   Potassium 7.4 (*)    CO2 21 (*)    Glucose, Bld 109 (*)     BUN 40 (*)    Creatinine, Ser 2.33 (*)    Calcium 10.5 (*)    GFR, Estimated 30 (*)    All other components within normal limits  I-STAT CG4 LACTIC ACID, ED  I-STAT CG4 LACTIC ACID, ED    EKG: None  Radiology: No results found.   Procedures   Medications Ordered in the ED - No data to display                                  Medical Decision Making Risk OTC drugs. Prescription drug management. Decision regarding hospitalization.   Patient is EKG shows normal sinus rhythm.  Does have left bundle right bundle branch block.  Patient has evidence of acute kidney injury.  His BUN and creatinine elevated.  His potassium is 7.4.  Will treat for hyperkalemia and patient will require admission.   10:33 PM Spoke with nephrology and requested repeat potassium which came back at 6.4 which is done by an i-STAT.  They will see the patient in consultation  Final diagnoses:  None    ED Discharge Orders     None          Dasie Faden, MD 12/21/23 2108    Dasie Faden, MD 12/21/23 2233

## 2023-12-21 NOTE — H&P (Incomplete)
 History and Physical    Justin Cain. FMW:[id number] DOB: 04-Oct-1955 DOA: 12/21/2023  PCP: Joshua Francisco, MD  Patient coming from: [facility]  I have personally briefly reviewed patient's old medical records in [facility]  Chief Complaint: hypotension  insetting of pain medication and muscle relaxants  HPI: Justin Cain. is a 68 y.o. male with medical history significant of Anemia, HLD, GERD, Prostate cancer, Colon cancer, Spinal stenosis, who presents to ED from [facility] medical clinic where he was being seen for DJD of hip and routine refill of his pain medications. ON evaluation he was found to be hypotensive. Patient was noted to be asymptomatic. Patient notes no n/v/d/f/v/sob/ or chest pain and notes he feels at his baseline.    ED Course:  IN ED  Vitals:afeb, bp 126/78,hr 88, rr 16, sat 100%  WGC 9.2, hgb 11.6, plt 244 Na : 136,K 7.4, cl 105, bicarb 21, glu 109 , cr 2.33 (0.7) Repeat potssium 6.3  Albuterol  Renal u/s  EKG: NSR no stented T's  MPRESSION: 1. Grossly normal kidneys noting very limited exam.   Tx hyperkalemia protocol with lokelma , calcium  gluconate  Nephrology was consulted by EDP who recommended additional lokelma  and albuteral nebs  and q4h bmp .   Review of Systems: As per HPI otherwise 10 point review of systems negative.   Past Medical History:  Diagnosis Date   Anemia    Body mass index 40.0-44.9, adult (HCC)    Colon cancer (HCC)    Displacement of lumbar intervertebral disc without myelopathy    Fatigue    GERD (gastroesophageal reflux disease)    Hyperlipidemia    Morbid obesity (HCC)    Prostate cancer (HCC)    Spinal stenosis of lumbar region     Past Surgical History:  Procedure Laterality Date   COLON SURGERY     KNEE ARTHROSCOPY  1980   LAPAROSCOPY     LONGITUDINAL GASTRECTOMY  09/03/14   PROSTATE SURGERY       reports that he has never smoked. He has never used smokeless tobacco. He reports current  alcohol use. He reports that he does not use drugs.  Allergies[1]  No family history on file. *** Prior to Admission medications  Medication Sig Start Date End Date Taking? Authorizing Provider  aspirin EC 325 MG tablet Take 325 mg by mouth daily.   Yes [provider]  buPROPion  (WELLBUTRIN  XL) 300 MG 24 hr tablet Take 300 mg by mouth daily. 10/11/23  Yes [provider]  cyclobenzaprine  (FLEXERIL ) 10 MG tablet Take 10 mg by mouth 3 (three) times daily as needed for muscle spasms.   Yes [provider]  docusate sodium  (COLACE) 100 MG capsule Take 100 mg by mouth 2 (two) times daily.    Yes [provider]  ferrous sulfate  324 MG TBEC Take 324 mg by mouth daily with breakfast.   Yes [provider]  gabapentin  (NEURONTIN ) 800 MG tablet Take 800 mg by mouth at bedtime. 11/30/23  Yes [provider]  hydrochlorothiazide (HYDRODIURIL) 12.5 MG tablet Take 12.5 mg by mouth daily.   Yes [provider]  lisinopril (ZESTRIL) 5 MG tablet Take 5 mg by mouth daily. 10/13/23  Yes [provider]  omeprazole (PRILOSEC) 20 MG capsule Take 20 mg by mouth every morning.    Yes [provider]  oxyCODONE -acetaminophen  (PERCOCET/ROXICET) 5-325 MG tablet Take 1 tablet by mouth 4 (four) times daily as needed.   Yes  [provider]  ranitidine (ZANTAC) 150 MG tablet Take 150 mg by mouth every morning.    Yes [provider]  rosuvastatin  (CRESTOR ) 20 MG tablet Take 20 mg by mouth daily. 10/16/23  Yes [provider]  Testosterone 1.62 % GEL Apply 1 Pump topically at bedtime. Both Shoulders at night 08/09/23  Yes [provider]  Vitamin D , Ergocalciferol , (DRISDOL ) 1.25 MG (50000 UNIT) CAPS capsule Take 50,000 Units by mouth once a week. Mondays 11/21/23  Yes [provider]  methocarbamol  (ROBAXIN ) 500 MG tablet Take 500 mg by mouth every 6 (six) hours as needed for muscle spasms. Patient  not taking: Reported on 12/21/2023    [provider]  oxycodone  (OXY-IR) 5 MG capsule Take 1 capsule (5 mg total) by mouth every 3 (three) hours as needed. 1 tablet for moderate pain, 2 tablets for severe pain Patient not taking: Reported on 12/21/2023 11/08/15   Caro Harlene POUR, NP  Protein (PROCEL) POWD Take 1 scoop by mouth 2 (two) times daily. Patient not taking: Reported on 12/21/2023    [provider]    Physical Exam: Vitals:   12/21/23 1715 12/21/23 2115  BP: 126/78 132/66  Pulse: 88 (!) 59  Resp: 16 (!) 24  Temp: 98.7 F (37.1 C) 98.5 F (36.9 C)  TempSrc: Oral   SpO2: 100% 100%    Constitutional: NAD, calm, comfortable Vitals:   12/21/23 1715 12/21/23 2115  BP: 126/78 132/66  Pulse: 88 (!) 59  Resp: 16 (!) 24  Temp: 98.7 F (37.1 C) 98.5 F (36.9 C)  TempSrc: Oral   SpO2: 100% 100%   Eyes: PERRL, lids and conjunctivae normal ENMT: Mucous membranes are moist. Posterior pharynx clear of any exudate or lesions.Normal dentition.  Neck: normal, supple, no masses, no thyromegaly Respiratory: clear to auscultation bilaterally, no wheezing, no crackles. Normal respiratory effort. No accessory muscle use.  Cardiovascular: Regular rate and rhythm, no murmurs / rubs / gallops. No extremity edema. 2+ pedal pulses. No carotid bruits.  Abdomen: no tenderness, no masses palpated. No hepatosplenomegaly. Bowel sounds positive.  Musculoskeletal: no clubbing / cyanosis. No joint deformity upper and lower extremities. Good ROM, no contractures. Normal muscle tone.  Skin: no rashes, lesions, ulcers. No induration Neurologic: CN 2-12 grossly intact. Sensation intact, DTR normal. Strength 5/5 in all 4.  Psychiatric: Normal judgment and insight. Alert and oriented x 3. Normal mood.    Labs on Admission: I have personally reviewed following labs and imaging studies  CBC: Recent Labs  Lab 12/21/23 1859 12/21/23 2230  WBC 9.2  --   HGB 11.6* 12.6*  HCT 38.7*  37.0*  MCV 92.8  --   PLT 244  --    Basic Metabolic Panel: Recent Labs  Lab 12/21/23 2014 12/21/23 2230  NA 136 139  K 7.4* 6.3*  CL 105 111  CO2 21*  --   GLUCOSE 109* 56*  BUN 40* 47*  CREATININE 2.33* 2.50*  CALCIUM  10.5*  --    GFR: CrCl cannot be calculated (Unknown ideal weight.). Liver Function Tests: Recent Labs  Lab 12/21/23 2014  AST 31  ALT 19  ALKPHOS 61  BILITOT 0.5  PROT 8.1  ALBUMIN 4.5   No results for input(s): LIPASE, AMYLASE in the last 168 hours. No results for input(s): AMMONIA in the last 168 hours. Coagulation Profile: No results for input(s): INR, PROTIME in the last 168 hours. Cardiac Enzymes: No results for input(s): CKTOTAL, CKMB, CKMBINDEX, TROPONINI in the last  168 hours. BNP (last 3 results) No results for input(s): PROBNP in the last 8760 hours. HbA1C: No results for input(s): HGBA1C in the last 72 hours. CBG: No results for input(s): GLUCAP in the last 168 hours. Lipid Profile: No results for input(s): CHOL, HDL, LDLCALC, TRIG, CHOLHDL, LDLDIRECT in the last 72 hours. Thyroid Function Tests: No results for input(s): TSH, T4TOTAL, FREET4, T3FREE, THYROIDAB in the last 72 hours. Anemia Panel: No results for input(s): VITAMINB12, FOLATE, FERRITIN, TIBC, IRON, RETICCTPCT in the last 72 hours. Urine analysis:    Component Value Date/Time   COLORURINE LT YELLOW 11/22/2007 1013   APPEARANCEUR Clear 11/22/2007 1013   LABSPEC 1.025 11/22/2007 1013   PHURINE 6.0 11/22/2007 1013   GLUCOSEU NEGATIVE 11/22/2007 1013   BILIRUBINUR NEGATIVE 11/22/2007 1013   KETONESUR NEGATIVE 11/22/2007 1013   UROBILINOGEN 0.2 mg/dL 88/86/7990 8986   NITRITE Negative 11/22/2007 1013   LEUKOCYTESUR Negative 11/22/2007 1013    Radiological Exams on Admission: US  RENAL Result Date: 12/21/2023 EXAM: US  Retroperitoneum Complete, Renal. 12/21/2023 11:03:32 PM TECHNIQUE: Real-time ultrasonography  of the retroperitoneum renal was performed. COMPARISON: CT abdomen 03/15/2008 CLINICAL HISTORY: AKI (acute kidney injury) FINDINGS: LIMITATIONS: Very limited visualization of the kidneys due to body habitus and ribs shadowing. FINDINGS: RIGHT KIDNEY/URETER: Right kidney measures 9.1 x 4.2 x 4.1 cm. Normal cortical echogenicity. No hydronephrosis. No calculus. No mass. LEFT KIDNEY/URETER: Left kidney measures 10.6 x 5.0 x 4.3 cm. Normal cortical echogenicity. No hydronephrosis. No calculus. No mass. BLADDER: Unremarkable appearance of the bladder. IMPRESSION: 1. Grossly normal kidneys noting very limited exam. Electronically signed by: Norman Gatlin MD 12/21/2023 11:21 PM EST RP Workstation: [id number]    EKG: Independently reviewed. 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 -supportive care for pain -avoid nephrotoxic  medications   Anemia -stable h./h at 11.6    HLD -continues statin    GERD, -ppi    Prostate cancer -no active issues    Colon cancer -no active issues    Spinal stenosis -supportive care for pain     DVT prophylaxis:  heparin   Code Status: full/ as discussed per patient wishes in event of cardiac arrest  Family Communication: none Disposition Plan: patient  expected to be admitted greater than 2 midnights  Consults called: Dr Gearline renal Admission status: progressive care   Camila DELENA Ned MD [facility]   If 7PM-7AM, please contact night-coverage [email or web address] Password [facility]  12/21/2023, 11:29 PM       [1]  Allergies Allergen Reactions   Bee Venom    Nsaids    Penicillins     Has patient had a PCN reaction causing immediate rash, facial/tongue/throat swelling, SOB or lightheadedness with hypotension: No Has patient had a  PCN reaction causing severe rash involving mucus membranes or skin necrosis: No Has patient had a PCN reaction that required hospitalization No Has patient had a PCN reaction occurring within the last 10 years: No If all of the above answers are NO, then may proceed with Cephalosporin use.

## 2023-12-21 NOTE — ED Notes (Signed)
 Pt. I-stat Chem 8 results potassium 6.3, EDP, Allen made aware.

## 2023-12-21 NOTE — Progress Notes (Addendum)
 Received call re:pt 52M on lisinopril hypotensive 90s/60s at PCP AKI from WNL--> 2.33, K 7.4 without peaked T waves, slightly widened QRS (in RBBB setting) Received insulin/dextrose, Ca, lokelma Add albuterol neb, another dose of lokelma IVFs with NS @75  mL/ hr RUS Q4 BMP  Almarie Bonine MD [facility]

## 2023-12-22 ENCOUNTER — Encounter (HOSPITAL_COMMUNITY): Payer: Self-pay | Admitting: Student

## 2023-12-22 DIAGNOSIS — E875 Hyperkalemia: Principal | ICD-10-CM | POA: Diagnosis present

## 2023-12-22 DIAGNOSIS — N179 Acute kidney failure, unspecified: Secondary | ICD-10-CM | POA: Diagnosis present

## 2023-12-22 DIAGNOSIS — E8729 Other acidosis: Secondary | ICD-10-CM

## 2023-12-22 LAB — RETICULOCYTES
Immature Retic Fract: 7.6 % (ref 2.3–15.9)
RBC.: 3.79 MIL/uL — ABNORMAL LOW (ref 4.22–5.81)
Retic Count, Absolute: 38.3 K/uL (ref 19.0–186.0)
Retic Ct Pct: 1 % (ref 0.4–3.1)

## 2023-12-22 LAB — BASIC METABOLIC PANEL WITH GFR
Anion gap: 15 (ref 5–15)
Anion gap: 12 (ref 5–15)
Anion gap: 13 (ref 5–15)
BUN: 39 mg/dL — ABNORMAL HIGH (ref 8–23)
BUN: 34 mg/dL — ABNORMAL HIGH (ref 8–23)
BUN: 35 mg/dL — ABNORMAL HIGH (ref 8–23)
CO2: 16 mmol/L — ABNORMAL LOW (ref 22–32)
CO2: 19 mmol/L — ABNORMAL LOW (ref 22–32)
CO2: 18 mmol/L — ABNORMAL LOW (ref 22–32)
Calcium: 10.5 mg/dL — ABNORMAL HIGH (ref 8.9–10.3)
Calcium: 9.6 mg/dL (ref 8.9–10.3)
Calcium: 10 mg/dL (ref 8.9–10.3)
Chloride: 105 mmol/L (ref 98–111)
Chloride: 109 mmol/L (ref 98–111)
Chloride: 103 mmol/L (ref 98–111)
Creatinine, Ser: 2.16 mg/dL — ABNORMAL HIGH (ref 0.61–1.24)
Creatinine, Ser: 1.93 mg/dL — ABNORMAL HIGH (ref 0.61–1.24)
Creatinine, Ser: 2.19 mg/dL — ABNORMAL HIGH (ref 0.61–1.24)
GFR, Estimated: 33 mL/min — ABNORMAL LOW (ref >60)
GFR, Estimated: 37 mL/min — ABNORMAL LOW (ref >60)
GFR, Estimated: 32 mL/min — ABNORMAL LOW (ref >60)
Glucose, Bld: 91 mg/dL (ref 70–99)
Glucose, Bld: 78 mg/dL (ref 70–99)
Glucose, Bld: 109 mg/dL — ABNORMAL HIGH (ref 70–99)
Potassium: 6.5 mmol/L (ref 3.5–5.1)
Potassium: 5.2 mmol/L — ABNORMAL HIGH (ref 3.5–5.1)
Potassium: 6 mmol/L — ABNORMAL HIGH (ref 3.5–5.1)
Sodium: 135 mmol/L (ref 135–145)
Sodium: 140 mmol/L (ref 135–145)
Sodium: 135 mmol/L (ref 135–145)

## 2023-12-22 LAB — COMPREHENSIVE METABOLIC PANEL WITH GFR
ALT: 18 U/L (ref 0–44)
AST: 33 U/L (ref 15–41)
Albumin: 3.8 g/dL (ref 3.5–5.0)
Alkaline Phosphatase: 55 U/L (ref 38–126)
Anion gap: 12 (ref 5–15)
BUN: 36 mg/dL — ABNORMAL HIGH (ref 8–23)
CO2: 18 mmol/L — ABNORMAL LOW (ref 22–32)
Calcium: 10 mg/dL (ref 8.9–10.3)
Chloride: 107 mmol/L (ref 98–111)
Creatinine, Ser: 2.03 mg/dL — ABNORMAL HIGH (ref 0.61–1.24)
GFR, Estimated: 35 mL/min — ABNORMAL LOW (ref >60)
Glucose, Bld: 124 mg/dL — ABNORMAL HIGH (ref 70–99)
Potassium: 5.5 mmol/L — ABNORMAL HIGH (ref 3.5–5.1)
Sodium: 137 mmol/L (ref 135–145)
Total Bilirubin: 0.4 mg/dL (ref 0.0–1.2)
Total Protein: 6.8 g/dL (ref 6.5–8.1)

## 2023-12-22 LAB — URINALYSIS, ROUTINE W REFLEX MICROSCOPIC
Bilirubin Urine: NEGATIVE
Glucose, UA: NEGATIVE mg/dL
Hgb urine dipstick: NEGATIVE
Ketones, ur: NEGATIVE mg/dL
Leukocytes,Ua: NEGATIVE
Nitrite: NEGATIVE
Protein, ur: NEGATIVE mg/dL
Specific Gravity, Urine: 1.013 (ref 1.005–1.030)
pH: 6 (ref 5.0–8.0)

## 2023-12-22 LAB — RENAL FUNCTION PANEL
Albumin: 3.9 g/dL (ref 3.5–5.0)
Anion gap: 16 — ABNORMAL HIGH (ref 5–15)
BUN: 36 mg/dL — ABNORMAL HIGH (ref 8–23)
CO2: 15 mmol/L — ABNORMAL LOW (ref 22–32)
Calcium: 10 mg/dL (ref 8.9–10.3)
Chloride: 107 mmol/L (ref 98–111)
Creatinine, Ser: 2.11 mg/dL — ABNORMAL HIGH (ref 0.61–1.24)
GFR, Estimated: 33 mL/min — ABNORMAL LOW (ref >60)
Glucose, Bld: 124 mg/dL — ABNORMAL HIGH (ref 70–99)
Phosphorus: 3.6 mg/dL (ref 2.5–4.6)
Potassium: 5.5 mmol/L — ABNORMAL HIGH (ref 3.5–5.1)
Sodium: 138 mmol/L (ref 135–145)

## 2023-12-22 LAB — CBC
HCT: 35.5 % — ABNORMAL LOW (ref 39.0–52.0)
Hemoglobin: 10.6 g/dL — ABNORMAL LOW (ref 13.0–17.0)
MCH: 27.9 pg (ref 26.0–34.0)
MCHC: 29.9 g/dL — ABNORMAL LOW (ref 30.0–36.0)
MCV: 93.4 fL (ref 80.0–100.0)
Platelets: 195 K/uL (ref 150–400)
RBC: 3.8 MIL/uL — ABNORMAL LOW (ref 4.22–5.81)
RDW: 14.2 % (ref 11.5–15.5)
WBC: 10.4 K/uL (ref 4.0–10.5)
nRBC: 0 % (ref 0.0–0.2)

## 2023-12-22 LAB — CBG MONITORING, ED: Glucose-Capillary: 111 mg/dL — ABNORMAL HIGH (ref 70–99)

## 2023-12-22 LAB — CK: Total CK: 238 U/L (ref 49–397)

## 2023-12-22 LAB — IRON AND TIBC
Iron: 64 ug/dL (ref 45–182)
Saturation Ratios: 19 % (ref 17.9–39.5)
TIBC: 328 ug/dL (ref 250–450)
UIBC: 264 ug/dL

## 2023-12-22 LAB — FERRITIN: Ferritin: 292 ng/mL (ref 24–336)

## 2023-12-22 LAB — ALBUMIN: Albumin: 3.9 g/dL (ref 3.5–5.0)

## 2023-12-22 LAB — HIV ANTIBODY (ROUTINE TESTING W REFLEX): HIV Screen 4th Generation wRfx: NONREACTIVE

## 2023-12-22 LAB — FOLATE: Folate: 13.3 ng/mL (ref >5.9)

## 2023-12-22 LAB — VITAMIN B12: Vitamin B-12: 438 pg/mL (ref 180–914)

## 2023-12-22 MED ORDER — PANTOPRAZOLE SODIUM 40 MG PO TBEC
40.0000 mg | DELAYED_RELEASE_TABLET | Freq: Every day | ORAL | Status: DC
  Administered 2023-12-22 – 2023-12-24 (×3): 40 mg via ORAL
  Filled 2023-12-22 (×3): qty 1

## 2023-12-22 MED ORDER — OXYCODONE-ACETAMINOPHEN 5-325 MG PO TABS
1.0000 | ORAL_TABLET | Freq: Four times a day (QID) | ORAL | Status: DC | PRN
  Administered 2023-12-22 – 2023-12-23 (×3): 1 via ORAL
  Filled 2023-12-22 (×3): qty 1

## 2023-12-22 MED ORDER — STERILE WATER FOR INJECTION IV SOLN
INTRAVENOUS | Status: DC
  Filled 2023-12-22: qty 150
  Filled 2023-12-22 (×2): qty 1000
  Filled 2023-12-22: qty 150

## 2023-12-22 MED ORDER — INSULIN ASPART 100 UNIT/ML IV SOLN
5.0000 [IU] | Freq: Once | INTRAVENOUS | Status: AC
  Administered 2023-12-22: 5 [IU] via INTRAVENOUS
  Filled 2023-12-22: qty 5

## 2023-12-22 MED ORDER — SODIUM ZIRCONIUM CYCLOSILICATE 10 G PO PACK
10.0000 g | PACK | Freq: Once | ORAL | Status: AC
  Administered 2023-12-22: 10 g via ORAL
  Filled 2023-12-22: qty 1

## 2023-12-22 MED ORDER — GABAPENTIN 100 MG PO CAPS
200.0000 mg | ORAL_CAPSULE | Freq: Three times a day (TID) | ORAL | Status: DC
  Administered 2023-12-22 – 2023-12-24 (×7): 200 mg via ORAL
  Filled 2023-12-22 (×7): qty 2

## 2023-12-22 MED ORDER — CALCIUM GLUCONATE-NACL 1-0.675 GM/50ML-% IV SOLN
1.0000 g | Freq: Once | INTRAVENOUS | Status: AC
  Administered 2023-12-22: 1000 mg via INTRAVENOUS
  Filled 2023-12-22: qty 50

## 2023-12-22 MED ORDER — SODIUM CHLORIDE 0.9 % IV BOLUS
1250.0000 mL | Freq: Once | INTRAVENOUS | Status: AC
  Administered 2023-12-22: 1250 mL via INTRAVENOUS

## 2023-12-22 MED ORDER — SODIUM ZIRCONIUM CYCLOSILICATE 10 G PO PACK
10.0000 g | PACK | Freq: Two times a day (BID) | ORAL | Status: AC
  Administered 2023-12-22: 10 g via ORAL
  Filled 2023-12-22: qty 1

## 2023-12-22 MED ORDER — SODIUM CHLORIDE 0.45 % IV SOLN
INTRAVENOUS | Status: DC
  Filled 2023-12-22 (×4): qty 75

## 2023-12-22 MED ORDER — SODIUM BICARBONATE 8.4 % IV SOLN
50.0000 meq | Freq: Once | INTRAVENOUS | Status: AC
  Administered 2023-12-22: 50 meq via INTRAVENOUS
  Filled 2023-12-22: qty 50

## 2023-12-22 MED ORDER — DEXTROSE 50 % IV SOLN
1.0000 | Freq: Once | INTRAVENOUS | Status: AC
  Administered 2023-12-22: 50 mL via INTRAVENOUS
  Filled 2023-12-22: qty 50

## 2023-12-22 MED ORDER — CALCIUM GLUCONATE 10 % IV SOLN
1.0000 g | Freq: Once | INTRAVENOUS | Status: DC
  Filled 2023-12-22: qty 10

## 2023-12-22 MED ORDER — ALBUTEROL SULFATE (2.5 MG/3ML) 0.083% IN NEBU
10.0000 mg | INHALATION_SOLUTION | Freq: Once | RESPIRATORY_TRACT | Status: AC
  Administered 2023-12-22: 10 mg via RESPIRATORY_TRACT
  Filled 2023-12-22: qty 12

## 2023-12-22 NOTE — Consult Note (Addendum)
 Renal Service Consult Note [facility] Justin Cain Fret, MD  Patient: Justin Cain. Date: 12/22/2023 Requesting Physician: Dr. Kathrin   Reason for Consult: Renal failure HPI: The patient is a 68 y.o. year-old w/ PMH of prostate cancer, colon cancer, morbid obesity, HL, HTN, anemia, spinal stenosis lumbar region who presented to ED last night after having hypotension at [facility]. Hx of syncope, also takes narcotic pain meds and muscle relaxants. In ED BP was 126/78, HR 88, RR 16, temp 98.7, RA 100% sats. Labs showed K+ 7.4, Co2 21, BUN 40, creat 2.33, wbc 9K, Hb 11.6, alb 4.5, LFT's ok. Pt was rx'd w/ temporizing measures twice for high K+, also got lokelma  10gm at 9pmp last night and 10gm at 4 am today. IVFs were given for vol depletion w/ sod bicarb at 75 cc/hr. Repeat K+ overnight showed 6.5 at 3 am today, 5.5 at 5 am and 5.2 at 7 am.  Creat down to 1.93 this am. Pt was admitted. We are asked to see for renal failure.   Pt seen in room. No UOP recorded as pt was in an ED room all night. Now on the floor.  Denies any dehydrating illness.    Pt seen in room. No hx of recent n/v/d, no nsaid use. Denies hx of renal failure. No hx stones.    ROS - denies CP, no joint pain, no HA, no blurry vision, no rash, no diarrhea, no nausea/ vomiting   Past Medical History  Past Medical History:  Diagnosis Date   Anemia    Body mass index 40.0-44.9, adult (HCC)    Colon cancer (HCC)    Displacement of lumbar intervertebral disc without myelopathy    Fatigue    GERD (gastroesophageal reflux disease)    Hyperlipidemia    Morbid obesity (HCC)    Prostate cancer (HCC)    Spinal stenosis of lumbar region    Past Surgical History  Past Surgical History:  Procedure Laterality Date   COLON SURGERY     KNEE ARTHROSCOPY  1980   LAPAROSCOPY     LONGITUDINAL GASTRECTOMY  09/03/14   PROSTATE SURGERY     Family History No family history on file. Social History   reports that he has never smoked. He has never used smokeless tobacco. He reports current alcohol use. He reports that he does not use drugs. Allergies Allergies[1] Home medications Prior to Admission medications  Medication Sig Start Date End Date Taking? Authorizing Provider  aspirin EC 325 MG tablet Take 325 mg by mouth daily.   Yes [provider]  buPROPion  (WELLBUTRIN  XL) 300 MG 24 hr tablet Take 300 mg by mouth daily. 10/11/23  Yes [provider]  cyclobenzaprine  (FLEXERIL ) 10 MG tablet Take 10 mg by mouth 3 (three) times daily as needed for muscle spasms.   Yes [provider]  docusate sodium  (COLACE) 100 MG capsule Take 100 mg by mouth 2 (two) times daily.    Yes [provider]  ferrous sulfate  324 MG TBEC Take 324 mg by mouth daily with breakfast.   Yes [provider]  gabapentin  (NEURONTIN ) 800 MG tablet Take 800 mg by mouth at bedtime. 11/30/23  Yes [provider]  hydrochlorothiazide (HYDRODIURIL) 12.5 MG tablet Take 12.5 mg by mouth daily.   Yes [provider]  lisinopril (ZESTRIL) 5 MG tablet Take 5 mg by mouth daily. 10/13/23  Yes [provider]  omeprazole (PRILOSEC) 20 MG capsule Take 20 mg  by mouth every morning.    Yes [provider]  oxyCODONE -acetaminophen  (PERCOCET/ROXICET) 5-325 MG tablet Take 1 tablet by mouth 4 (four) times daily as needed.   Yes [provider]  ranitidine (ZANTAC) 150 MG tablet Take 150 mg by mouth every morning.    Yes [provider]  rosuvastatin  (CRESTOR ) 20 MG tablet Take 20 mg by mouth daily. 10/16/23  Yes [provider]  Testosterone 1.62 % GEL Apply 1 Pump topically at bedtime. Both Shoulders at night 08/09/23  Yes [provider]  Vitamin D , Ergocalciferol , (DRISDOL ) 1.25 MG (50000 UNIT) CAPS capsule Take 50,000 Units by mouth once a week. Mondays 11/21/23  Yes [provider]  methocarbamol  (ROBAXIN ) 500 MG tablet  Take 500 mg by mouth every 6 (six) hours as needed for muscle spasms. Patient not taking: Reported on 12/21/2023    [provider]  oxycodone  (OXY-IR) 5 MG capsule Take 1 capsule (5 mg total) by mouth every 3 (three) hours as needed. 1 tablet for moderate pain, 2 tablets for severe pain Patient not taking: Reported on 12/21/2023 11/08/15   Caro Harlene POUR, NP  Protein (PROCEL) POWD Take 1 scoop by mouth 2 (two) times daily. Patient not taking: Reported on 12/21/2023    [provider]     Vitals:   12/22/23 9379 12/22/23 0630 12/22/23 1001 12/22/23 1355  BP: 131/71 137/69 126/78   Pulse: 69 66 87   Resp: (!) 21 17 (!) 21   Temp: 97.6 F (36.4 C)  (!) 97.5 F (36.4 C) 97.8 F (36.6 C)  TempSrc:   Oral Oral  SpO2: 98% 98% 100%    Exam Gen alert, no distress, obese Sclera anicteric, throat clear  No jvd or bruits Chest clear bilat to bases RRR no MRG Abd soft ntnd no mass or ascites +bs Ext no LE or UE edema, no other edema Neuro is alert, Ox 3 , nf, deconditioned   Home bp meds: Hydrochlorothiazide 12.5 mg qd Lisinopril 5mg  every day Others: asa, wellbutrin  xl, flexeril , colace, gabapentin , robaxin , PPI, oxy IR, percocet, zantac, crestor , vit/ supps     Date   Creat  eGFR (ml/min) 2016   0.7- 0.9 > 60 ml/min 2017   0.7   > 60 ml/min 2021   1.18  75 ml/min 12/21/23  2.33  Admit date 12/13   2.16, 1.93  today    UA 12/13- protein neg, neg LE/ Hb UNa, UCr pending   Renal US : 9.1/ 10.6 cm kidneys, no hydro, normal echotexture CXR - not done Labs today --> Na 138, K+ 5.5, CO2 18, BUN 36, creat 2.11 Ca 10.0, alb 3.9, Hb 12.6, WBC 9K     Assessment/ Plan: AKI: unclear baseline, last creatinine is 1.18 from 2021, eGFR 75 ml/min. Creat here was 2.3 on admit and is down to 1.9 this today. Making urine, not recorded yet. Pt was vol depleted given his alb of 4.5 on admission, and was taking hydrochlorothiazide and ACEi at home. Renal US  shows no  obstruction and UA is completely negative. Suspect AKI is due to vol depletion + ACEi effects. We should avoid diuretics/ ACEi/ ARB for 1-2 mos vs forever. Will bolus 1250 cc NS and re-order IVF's w/ bicarb at 100 cc/hr. Get bmet + f/u albumin tonight and plan regular labs tomorrow. Monitor UOP. Will follow.  Hyperkalemia: from AKI + acei. Better. Cont lokelma  10gm bid and renal diet w/o fluid restriction (low K diet).  H/o prostate cancer H/o  HTN: see above, avoid acei/ ARB/ diuretics for 2 mos vs forever.        Myer Fret  MD [facility] 12/22/2023, 6:03 PM  Recent Labs  Lab 12/22/23 0544 12/22/23 0800  CREATININE 2.11*  2.03* 1.93*  K 5.5*  5.5* 5.2*   Inpatient medications:  buPROPion   300 mg Oral Daily   docusate sodium   100 mg Oral BID   famotidine   20 mg Oral Daily   ferrous sulfate   324 mg Oral Q breakfast   gabapentin   200 mg Oral TID   heparin   5,000 Units Subcutaneous Q8H   pantoprazole   40 mg Oral Daily   rosuvastatin   20 mg Oral Daily   sodium zirconium cyclosilicate   10 g Oral BID   [START ON 12/24/2023] Vitamin D  (Ergocalciferol )  50,000 Units Oral Weekly    sodium bicarbonate  150 mEq in sterile water  1,150 mL infusion     sodium chloride      albuterol , cyclobenzaprine , ondansetron  **OR** ondansetron  (ZOFRAN ) IV, oxyCODONE -acetaminophen , traMADol       [1]  Allergies Allergen Reactions   Bee Venom    Nsaids    Penicillins     Has patient had a PCN reaction causing immediate rash, facial/tongue/throat swelling, SOB or lightheadedness with hypotension: No Has patient had a PCN reaction causing severe rash involving mucus membranes or skin necrosis: No Has patient had a PCN reaction that required hospitalization No Has patient had a PCN reaction occurring within the last 10 years: No If all of the above answers are NO, then may proceed with Cephalosporin use.

## 2023-12-22 NOTE — Evaluation (Signed)
 Occupational Therapy Evaluation Patient Details Name: Justin Cain. 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 PMH: HLD, GERD, prostate CA, colon CA, spinal stenosis     Clinical Impressions The pt is currently presenting slightly below his baseline level of functioning for self care management. He is presenting with the below listed deficits (see OT problem list). During the session, he required min assist to stand, min assist for lower body dressing, and SBA to light CGA for dynamic standing activities. He will benefit from OT services in the acute care setting to maximize his independence with ADLs and to facilitate his safe return home. No post-acute OT needs anticipated.      If plan is discharge home, recommend the following:   Help with stairs or ramp for entrance     Functional Status Assessment   Patient has had a recent decline in their functional status and demonstrates the ability to make significant improvements in function in a reasonable and predictable amount of time.     Equipment Recommendations   None recommended by OT     Recommendations for Other Services         Precautions/Restrictions   Restrictions Weight Bearing Restrictions Per Provider Order: No     Mobility Bed Mobility Overal bed mobility: Needs Assistance Bed Mobility: Supine to Sit     Supine to sit: Mod assist     General bed mobility comments: required assist for BLE off the bed; pt reported difficulty with his at his baseline, due to a prior injury where he broke both hips    Transfers Overall transfer level: Needs assistance Equipment used: Straight cane Transfers: Sit to/from Stand Sit to Stand: Min assist, From elevated surface                  Balance     Sitting  balance-Leahy Scale: Good       Standing balance-Leahy Scale: Fair          ADL either performed or assessed with clinical judgement   ADL Overall ADL's : Needs assistance/impaired Eating/Feeding: Independent;Sitting   Grooming: Set up;Sitting;Contact guard assist;Standing Grooming Details (indicate cue type and reason): set-up seated or SBA to light CGA standing Upper Body Bathing: Set up;Sitting   Lower Body Bathing: Contact guard assist;Sitting/lateral leans   Upper Body Dressing : Set up;Sitting   Lower Body Dressing: Minimal assistance;Sitting/lateral leans Lower Body Dressing Details (indicate cue type and reason): assist needed to donn socks seated EOB Toilet Transfer: Contact guard assist;Ambulation (used a cane)   Toileting- Clothing Manipulation and Hygiene: Contact guard assist;Sit to/from stand               Vision   Additional Comments: He correctly read the time depicted on the wall clock.            Pertinent Vitals/Pain Pain Assessment Pain Assessment: No/denies pain     Extremity/Trunk Assessment Upper Extremity Assessment Upper Extremity Assessment: Right hand dominant;Overall [facility] for tasks assessed   Lower Extremity Assessment Lower Extremity Assessment: Overall WFL for tasks assessed       Communication Communication Communication: No apparent difficulties   Cognition Arousal: Alert Behavior During Therapy: WFL for tasks assessed/performed Cognition: No apparent impairments  OT - Cognition Comments: Oriented x4      Following commands: Intact       Cueing  General Comments   Cueing Techniques: Verbal cues              Home Living Family/patient expects to be discharged to:: Private residence Living Arrangements: Alone   Type of Home: Apartment Home Access: Stairs to enter Entrance Stairs-Number of Steps: 2 Entrance Stairs-Rails: None Home Layout: Two level;1/2 bath on main level Alternate Level  Stairs-Number of Steps: bedroom and full bathroom upstairs   Bathroom Shower/Tub: Chief Strategy Officer: Handicapped height     Home Equipment: Cane - single point;Shower seat   Additional Comments: has a sports administrator and sock aid he uses at home      Prior Functioning/Environment Prior Level of Function : Independent/Modified Independent;Driving             Mobility Comments: He used a cane for ambulation outside the home. ADLs Comments: Modified independent to independent with ADLs, driving, and cooking. He takes spoongebaths. Retired emergency planning/management officer.    OT Problem List: Impaired balance (sitting and/or standing);Decreased knowledge of use of DME or AE   OT Treatment/Interventions: Self-care/ADL training;Therapeutic exercise;Energy conservation;Therapeutic activities;DME and/or AE instruction;Balance training;Patient/family education      OT Goals(Current goals can be found in the care plan section)   Acute Rehab OT Goals OT Goal Formulation: With patient Time For Goal Achievement: 01/05/24 Potential to Achieve Goals: Good ADL Goals Pt Will Perform Grooming: with modified independence;standing Pt Will Perform Lower Body Dressing: with modified independence;sitting/lateral leans;with adaptive equipment;sit to/from stand Pt Will Transfer to Toilet: with modified independence;ambulating Pt Will Perform Toileting - Clothing Manipulation and hygiene: with modified independence;sit to/from stand   OT Frequency:  Min 2X/week       AM-PAC OT 6 Clicks Daily Activity     Outcome Measure Help from another person eating meals?: None Help from another person taking care of personal grooming?: A Little Help from another person toileting, which includes using toliet, bedpan, or urinal?: A Little Help from another person bathing (including washing, rinsing, drying)?: A Little Help from another person to put on and taking off regular upper body clothing?: None Help from  another person to put on and taking off regular lower body clothing?: A Little 6 Click Score: 20   End of Session Equipment Utilized During Treatment: Gait belt (cane) Nurse Communication: Mobility status  Activity Tolerance: Patient tolerated treatment well Patient left: in chair;with call bell/phone within reach  OT Visit Diagnosis: Other abnormalities of gait and mobility (R26.89)                Time: 8359-8342 OT Time Calculation (min): 17 min Charges:  OT General Charges $OT Visit: 1 Visit OT Evaluation $OT Eval Low Complexity: 1 Low    Delanna JINNY Lesches, OTR/L 12/22/2023, 5:08 PM

## 2023-12-22 NOTE — Progress Notes (Signed)
 PROGRESS NOTE  Justin Cain. FMW:[id number] DOB: June 25, 1955   PCP: Joshua Francisco, MD  Patient is from: Home.  Lives alone.  Uses cane for mobility.  DOA: 12/21/2023 LOS: 1  Chief complaints Chief Complaint  Patient presents with   Hypotension     Brief Narrative / Interim history: 68 year old M with PMH of DJD/bilateral hip pain, colon cancer, prostate cancer, spinal stenosis, hypertension, anemia, hyperlipidemia, GERD and obesity sent to ED from [facility] pain clinic due to hypotension.  Patient was at clinic for his chronic pain and pain medication.  On evaluation, noted to be hypotensive and sent to ED.  In ED, stable vitals.  Hgb 11.6. Cr 2.33 (was 1.18 in 2021).  K7.4 but improved to 6.3 on repeat.  EKG NSR without ST and T waves.  Started on IV fluids and hyperkalemia treatment, and admitted for further care.  Renal ultrasound without significant finding.  Subjective: Seen and examined earlier this morning.  No major events overnight or this morning.  No complaints other than chronic epigastric pain that he attributes to his prior gastric surgery.  Denies chest pain, dyspnea, palpitation or dizziness.  Denies GI or UTI symptoms.  Denies smoking cigarettes or drinking alcohol.   Assessment and plan: AKI: Unknown baseline but creatinine 1.18 in 2021.  He is on lisinopril and HCTZ.  Denies NSAID use.  Renal US  without significant finding.  AKI improving with IV fluid Recent Labs    12/21/23 2014 12/21/23 2230 12/22/23 0324 12/22/23 0544 12/22/23 0800  BUN 40* 47* 39* 36*  36* 34*  CREATININE 2.33* 2.50* 2.16* 2.11*  2.03* 1.93*  - Change IV fluid to sodium bicarb given acidosis. -Continue holding lisinopril and HCTZ - Continue monitoring  Hyperkalemia: Likely due to AKI and lisinopril.  Improving - Correct acidosis with sodium bicarbonate  - Continue Lokelma  - Continue monitoring  High anion gap metabolic acidosis - Change IV fluids to sodium  bicarbonate.  Hypertension: Has been normotensive.  Off his home lisinopril and HCTZ - Continue holding home meds.  Chronic back pain/DJD/bilateral hip pain-followed at [facility] clinic for [facility] management. - Continue tramadol , Flexeril  and Percocet. - Changed gabapentin  from 800 mg daily to 200 mg 3 times daily. - PT/OT eval  Hyperlipidemia - Continue home Crestor .  GERD - PPI  Mood disorder: Stable - Continue home meds  Normocytic anemia: Relatively stable. -Monitor   There is no height or weight on file to calculate BMI.           DVT prophylaxis:  heparin  injection 5,000 Units Start: 12/22/23 0600  Code Status: Full code Family Communication: None at bedside Level of care: Progressive Status is: Inpatient Remains inpatient appropriate because: AKI and hyperkalemia   Final disposition: Home   55 minutes with more than 50% spent in reviewing records, counseling patient/family and coordinating care.  Consultants:  None  Procedures: None  Microbiology summarized: None  Objective: Vitals:   12/22/23 0300 12/22/23 0620 12/22/23 0630 12/22/23 1001  BP: (!) 143/53 131/71 137/69 126/78  Pulse: 77 69 66 87  Resp: 20 (!) 21 17 (!) 21  Temp: 97.8 F (36.6 C) 97.6 F (36.4 C)  (!) 97.5 F (36.4 C)  TempSrc:    Oral  SpO2: 100% 98% 98% 100%    Examination:  GENERAL: No apparent distress.  Nontoxic. HEENT: MMM.  Vision and hearing grossly intact.  NECK: Supple.  No apparent JVD.  RESP:  No IWOB.  Fair aeration bilaterally. CVS:  RRR. Heart sounds  normal.  ABD/GI/GU: BS+. Abd soft.  Mild epigastric tenderness. MSK/EXT:  Moves extremities.  Stasis dermatitis. SKIN: Stasis dermatitis. NEURO: AA.  Oriented appropriately.  No apparent focal neuro deficit. PSYCH: Calm. Normal affect.   Sch Meds:  Scheduled Meds:  buPROPion   300 mg Oral Daily   docusate sodium   100 mg Oral BID   famotidine   20 mg Oral Daily   ferrous sulfate   324 mg Oral Q breakfast    gabapentin   200 mg Oral TID   heparin   5,000 Units Subcutaneous Q8H   pantoprazole   40 mg Oral Daily   rosuvastatin   20 mg Oral Daily   sodium zirconium cyclosilicate   10 g Oral BID   [START ON 12/24/2023] Vitamin D  (Ergocalciferol )  50,000 Units Oral Weekly   Continuous Infusions:  sodium bicarbonate  75 mEq in sodium chloride  0.45 % 1,075 mL infusion 125 mL/hr at 12/22/23 0954   PRN Meds:.albuterol , cyclobenzaprine , ondansetron  **OR** ondansetron  (ZOFRAN ) IV, oxyCODONE -acetaminophen , traMADol   Antimicrobials: Anti-infectives (From admission, onward)    None        I have personally reviewed the following labs and images: CBC: Recent Labs  Lab 12/21/23 1859 12/21/23 2230 12/22/23 0359  WBC 9.2  --  10.4  HGB 11.6* 12.6* 10.6*  HCT 38.7* 37.0* 35.5*  MCV 92.8  --  93.4  PLT 244  --  195   BMP &GFR Recent Labs  Lab 12/21/23 2014 12/21/23 2230 12/22/23 0324 12/22/23 0544 12/22/23 0800  NA 136 139 135 138  137 140  K 7.4* 6.3* 6.5* 5.5*  5.5* 5.2*  CL 105 111 105 107  107 109  CO2 21*  --  16* 15*  18* 19*  GLUCOSE 109* 56* 91 124*  124* 78  BUN 40* 47* 39* 36*  36* 34*  CREATININE 2.33* 2.50* 2.16* 2.11*  2.03* 1.93*  CALCIUM  10.5*  --  10.5* 10.0  10.0 9.6  PHOS  --   --   --  3.6  --    CrCl cannot be calculated (Unknown ideal weight.). Liver & Pancreas: Recent Labs  Lab 12/21/23 2014 12/22/23 0544  AST 31 33  ALT 19 18  ALKPHOS 61 55  BILITOT 0.5 0.4  PROT 8.1 6.8  ALBUMIN 4.5 3.9  3.8   No results for input(s): LIPASE, AMYLASE in the last 168 hours. No results for input(s): AMMONIA in the last 168 hours. Diabetic: No results for input(s): HGBA1C in the last 72 hours. Recent Labs  Lab 12/22/23 0533  GLUCAP 111*   Cardiac Enzymes: No results for input(s): CKTOTAL, CKMB, CKMBINDEX, TROPONINI in the last 168 hours. No results for input(s): PROBNP in the last 8760 hours. Coagulation Profile: No results for  input(s): INR, PROTIME in the last 168 hours. Thyroid Function Tests: No results for input(s): TSH, T4TOTAL, FREET4, T3FREE, THYROIDAB in the last 72 hours. Lipid Profile: No results for input(s): CHOL, HDL, LDLCALC, TRIG, CHOLHDL, LDLDIRECT in the last 72 hours. Anemia Panel: No results for input(s): VITAMINB12, FOLATE, FERRITIN, TIBC, IRON, RETICCTPCT in the last 72 hours. Urine analysis:    Component Value Date/Time   COLORURINE YELLOW 12/22/2023 0331   APPEARANCEUR CLEAR 12/22/2023 0331   LABSPEC 1.013 12/22/2023 0331   PHURINE 6.0 12/22/2023 0331   GLUCOSEU NEGATIVE 12/22/2023 0331   GLUCOSEU NEGATIVE 11/22/2007 1013   HGBUR NEGATIVE 12/22/2023 0331   BILIRUBINUR NEGATIVE 12/22/2023 0331   KETONESUR NEGATIVE 12/22/2023 0331   PROTEINUR NEGATIVE 12/22/2023 0331   UROBILINOGEN 0.2 mg/dL 88/86/7990 8986  NITRITE NEGATIVE 12/22/2023 0331   LEUKOCYTESUR NEGATIVE 12/22/2023 0331   Sepsis Labs: Invalid input(s): PROCALCITONIN, LACTICIDVEN  Microbiology: No results found for this or any previous visit (from the past 240 hours).  Radiology Studies: US  RENAL Result Date: 12/21/2023 EXAM: US  Retroperitoneum Complete, Renal. 12/21/2023 11:03:32 PM TECHNIQUE: Real-time ultrasonography of the retroperitoneum renal was performed. COMPARISON: CT abdomen 03/15/2008 CLINICAL HISTORY: AKI (acute kidney injury) FINDINGS: LIMITATIONS: Very limited visualization of the kidneys due to body habitus and ribs shadowing. FINDINGS: RIGHT KIDNEY/URETER: Right kidney measures 9.1 x 4.2 x 4.1 cm. Normal cortical echogenicity. No hydronephrosis. No calculus. No mass. LEFT KIDNEY/URETER: Left kidney measures 10.6 x 5.0 x 4.3 cm. Normal cortical echogenicity. No hydronephrosis. No calculus. No mass. BLADDER: Unremarkable appearance of the bladder. IMPRESSION: 1. Grossly normal kidneys noting very limited exam. Electronically signed by: Norman Gatlin MD 12/21/2023 11:21  PM EST RP Workstation: [id number]      Ujbz T. Joniyah Mallinger [facility]  If 7PM-7AM, please contact night-coverage [email or web address] 12/22/2023, 11:06 AM

## 2023-12-23 LAB — RENAL FUNCTION PANEL
Albumin: 3.4 g/dL — ABNORMAL LOW (ref 3.5–5.0)
Anion gap: 13 (ref 5–15)
BUN: 29 mg/dL — ABNORMAL HIGH (ref 8–23)
CO2: 21 mmol/L — ABNORMAL LOW (ref 22–32)
Calcium: 9.2 mg/dL (ref 8.9–10.3)
Chloride: 104 mmol/L (ref 98–111)
Creatinine, Ser: 1.87 mg/dL — ABNORMAL HIGH (ref 0.61–1.24)
GFR, Estimated: 39 mL/min — ABNORMAL LOW (ref >60)
Glucose, Bld: 93 mg/dL (ref 70–99)
Phosphorus: 4 mg/dL (ref 2.5–4.6)
Potassium: 4.7 mmol/L (ref 3.5–5.1)
Sodium: 138 mmol/L (ref 135–145)

## 2023-12-23 LAB — CBC
HCT: 29.7 % — ABNORMAL LOW (ref 39.0–52.0)
Hemoglobin: 9.2 g/dL — ABNORMAL LOW (ref 13.0–17.0)
MCH: 27.2 pg (ref 26.0–34.0)
MCHC: 31 g/dL (ref 30.0–36.0)
MCV: 87.9 fL (ref 80.0–100.0)
Platelets: 209 K/uL (ref 150–400)
RBC: 3.38 MIL/uL — ABNORMAL LOW (ref 4.22–5.81)
RDW: 13.9 % (ref 11.5–15.5)
WBC: 7.5 K/uL (ref 4.0–10.5)
nRBC: 0 % (ref 0.0–0.2)

## 2023-12-23 LAB — MAGNESIUM: Magnesium: 1.6 mg/dL — ABNORMAL LOW (ref 1.7–2.4)

## 2023-12-23 MED ORDER — MAGNESIUM SULFATE IN D5W 1-5 GM/100ML-% IV SOLN
1.0000 g | Freq: Once | INTRAVENOUS | Status: AC
  Administered 2023-12-23: 1 g via INTRAVENOUS
  Filled 2023-12-23: qty 100

## 2023-12-23 NOTE — Progress Notes (Signed)
 PROGRESS NOTE  Justin Cain. FMW:[id number] DOB: 01-May-1955   PCP: Joshua Francisco, MD  Patient is from: Home.  Lives alone.  Uses cane for mobility.  DOA: 12/21/2023 LOS: 2  Chief complaints Chief Complaint  Patient presents with   Hypotension     Brief Narrative / Interim history: 68 year old M with PMH of DJD/bilateral hip pain, colon cancer, prostate cancer, spinal stenosis, hypertension, anemia, hyperlipidemia, GERD and obesity sent to ED from [facility] pain clinic due to hypotension.  Patient was at clinic for his chronic pain and pain medication.  On evaluation, noted to be hypotensive and sent to ED.  In ED, stable vitals.  Hgb 11.6. Cr 2.33 (was 1.18 in 2021).  K7.4 but improved to 6.3 on repeat.  EKG NSR without ST and T waves.  Started on IV fluids and hyperkalemia treatment, and admitted for further care.  Renal ultrasound without significant finding.  Nephrology consulted.  Hyperkalemia resolved.  AKI improved.  Nephrology following.   Subjective: Seen and examined earlier this morning.  No major events overnight or this morning.  No complaints.  Hyperkalemia resolved.  AKI improved.  Reports making good amount of urine although this was not captured.  Assessment and plan: AKI: Unknown baseline but creatinine 1.18 in 2021.  He is on lisinopril and HCTZ.  Denies NSAID use.  Renal US  without significant finding.  AKI improving with IV fluid.  Feels well. Recent Labs    12/21/23 2014 12/21/23 2230 12/22/23 0324 12/22/23 0544 12/22/23 0800 12/22/23 1824 12/23/23 0632  BUN 40* 47* 39* 36*  36* 34* 35* 29*  CREATININE 2.33* 2.50* 2.16* 2.11*  2.03* 1.93* 2.19* 1.87*  -Continue sodium bicarbonate  infusion -Continue holding lisinopril and HCTZ -Follow nephrology recs  Hyperkalemia: Not sure if AKI and lisinopril can fully explain the extent of his hyperkalemia.  Resolved. - Continue sodium bicarbonate  infusion. - Appreciate help by nephrology.  High anion gap  metabolic acidosis: Improved. - Sodium bicarbonate  infusion as above.  Hypertension: Has been normotensive.  Off his home lisinopril and HCTZ - Continue holding home meds.  Chronic back pain/DJD/bilateral hip pain-followed at [facility] clinic for pain management. - Continue tramadol , Flexeril  and Percocet. - Changed gabapentin  from 800 mg daily to 200 mg 3 times daily. - PT/OT eval  Anemia of renal disease: Slight drop in Hgb likely dilutional from IV fluid.  No overt bleeding.  Denies melena or hematochezia. Recent Labs    12/21/23 1859 12/21/23 2230 12/22/23 0359 12/23/23 0633  HGB 11.6* 12.6* 10.6* 9.2*  - Continue monitoring   Hyperlipidemia - Continue home Crestor .  Hypomagnesemia - Monitor replenish.  GERD - PPI  Mood disorder: Stable - Continue home meds  Normocytic anemia: Relatively stable. -Monitor  Morbid obesity Body mass index is 41.88 kg/m.           DVT prophylaxis:  heparin  injection 5,000 Units Start: 12/22/23 0600  Code Status: Full code Family Communication: None at bedside Level of care: Telemetry Status is: Inpatient Remains inpatient appropriate because: AKI and hyperkalemia   Final disposition: Home   55 minutes with more than 50% spent in reviewing records, counseling patient/family and coordinating care.  Consultants:  None  Procedures: None  Microbiology summarized: None  Objective: Vitals:   12/23/23 0253 12/23/23 0500 12/23/23 0802 12/23/23 0950  BP: 121/63  122/71   Pulse: 86  73   Resp: 16  20 15   Temp: 98.3 F (36.8 C)  97.8 F (36.6 C)   TempSrc: Oral  Oral   SpO2: 91%  94%   Weight:  117.7 kg      Examination:  GENERAL: No apparent distress.  Nontoxic. HEENT: MMM.  Vision and hearing grossly intact.  NECK: Supple.  No apparent JVD.  RESP:  No IWOB.  Fair aeration bilaterally. CVS:  RRR. Heart sounds normal.  ABD/GI/GU: BS+. Abd soft.  Mild epigastric tenderness. MSK/EXT:  Moves extremities.   Stasis dermatitis. SKIN: Stasis dermatitis. NEURO: AA.  Oriented appropriately.  No apparent focal neuro deficit. PSYCH: Calm. Normal affect.   Sch Meds:  Scheduled Meds:  buPROPion   300 mg Oral Daily   docusate sodium   100 mg Oral BID   famotidine   20 mg Oral Daily   ferrous sulfate   324 mg Oral Q breakfast   gabapentin   200 mg Oral TID   heparin   5,000 Units Subcutaneous Q8H   pantoprazole   40 mg Oral Daily   rosuvastatin   20 mg Oral Daily   [START ON 12/24/2023] Vitamin D  (Ergocalciferol )  50,000 Units Oral Weekly   Continuous Infusions:  sodium bicarbonate  150 mEq in sterile water  1,150 mL infusion 100 mL/hr at 12/23/23 0912   PRN Meds:.albuterol , cyclobenzaprine , ondansetron  **OR** ondansetron  (ZOFRAN ) IV, oxyCODONE -acetaminophen , traMADol   Antimicrobials: Anti-infectives (From admission, onward)    None        I have personally reviewed the following labs and images: CBC: Recent Labs  Lab 12/21/23 1859 12/21/23 2230 12/22/23 0359 12/23/23 0633  WBC 9.2  --  10.4 7.5  HGB 11.6* 12.6* 10.6* 9.2*  HCT 38.7* 37.0* 35.5* 29.7*  MCV 92.8  --  93.4 87.9  PLT 244  --  195 209   BMP &GFR Recent Labs  Lab 12/22/23 0324 12/22/23 0544 12/22/23 0800 12/22/23 1824 12/23/23 0632 12/23/23 0633  NA 135 138  137 140 135 138  --   K 6.5* 5.5*  5.5* 5.2* 6.0* 4.7  --   CL 105 107  107 109 103 104  --   CO2 16* 15*  18* 19* 18* 21*  --   GLUCOSE 91 124*  124* 78 109* 93  --   BUN 39* 36*  36* 34* 35* 29*  --   CREATININE 2.16* 2.11*  2.03* 1.93* 2.19* 1.87*  --   CALCIUM  10.5* 10.0  10.0 9.6 10.0 9.2  --   MG  --   --   --   --   --  1.6*  PHOS  --  3.6  --   --  4.0  --    CrCl cannot be calculated (Unknown ideal weight.). Liver & Pancreas: Recent Labs  Lab 12/21/23 2014 12/22/23 0544 12/22/23 1824 12/23/23 0632  AST 31 33  --   --   ALT 19 18  --   --   ALKPHOS 61 55  --   --   BILITOT 0.5 0.4  --   --   PROT 8.1 6.8  --   --   ALBUMIN 4.5 3.9   3.8 3.9 3.4*   No results for input(s): LIPASE, AMYLASE in the last 168 hours. No results for input(s): AMMONIA in the last 168 hours. Diabetic: No results for input(s): HGBA1C in the last 72 hours. Recent Labs  Lab 12/22/23 0533  GLUCAP 111*   Cardiac Enzymes: Recent Labs  Lab 12/22/23 0324  CKTOTAL 238   No results for input(s): PROBNP in the last 8760 hours. Coagulation Profile: No results for input(s): INR, PROTIME in the last 168 hours. Thyroid Function  Tests: No results for input(s): TSH, T4TOTAL, FREET4, T3FREE, THYROIDAB in the last 72 hours. Lipid Profile: No results for input(s): CHOL, HDL, LDLCALC, TRIG, CHOLHDL, LDLDIRECT in the last 72 hours. Anemia Panel: Recent Labs    12/22/23 1623  VITAMINB12 438  FOLATE 13.3  FERRITIN 292  TIBC 328  IRON 64  RETICCTPCT 1.0   Urine analysis:    Component Value Date/Time   COLORURINE YELLOW 12/22/2023 0331   APPEARANCEUR CLEAR 12/22/2023 0331   LABSPEC 1.013 12/22/2023 0331   PHURINE 6.0 12/22/2023 0331   GLUCOSEU NEGATIVE 12/22/2023 0331   GLUCOSEU NEGATIVE 11/22/2007 1013   HGBUR NEGATIVE 12/22/2023 0331   BILIRUBINUR NEGATIVE 12/22/2023 0331   KETONESUR NEGATIVE 12/22/2023 0331   PROTEINUR NEGATIVE 12/22/2023 0331   UROBILINOGEN 0.2 mg/dL 88/86/7990 8986   NITRITE NEGATIVE 12/22/2023 0331   LEUKOCYTESUR NEGATIVE 12/22/2023 0331   Sepsis Labs: Invalid input(s): PROCALCITONIN, LACTICIDVEN  Microbiology: No results found for this or any previous visit (from the past 240 hours).  Radiology Studies: No results found.     Sabine Tenenbaum T. Kaeden Depaz [facility]  If 7PM-7AM, please contact night-coverage [email or web address] 12/23/2023, 12:19 PM

## 2023-12-23 NOTE — Plan of Care (Signed)

## 2023-12-23 NOTE — Progress Notes (Signed)
 [facility] Progress Note  Subjective:  VSS, BP 120/70, on RA 98% sats 340 cc recorded UOP yesterday Creat 1.8 today  Presentation summary: 68 y.o. year-old w/ PMH of prostate cancer, colon cancer, morbid obesity, HL, HTN, anemia, spinal stenosis lumbar region who presented to ED last night after having hypotension at [facility]. Hx of syncope, also takes narcotic pain meds and muscle relaxants. In ED BP was 126/78, HR 88, RR 16, temp 98.7, RA 100% sats. Labs showed K+ 7.4, Co2 21, BUN 40, creat 2.33, wbc 9K, Hb 11.6, alb 4.5, LFT's ok. Pt was rx'd w/ temporizing measures twice for high K+, also got lokelma  10gm at 9pmp last night and 10gm at 4 am today. IVFs were given for vol depletion w/ sod bicarb at 75 cc/hr. Repeat K+ overnight showed 6.5 at 3 am today, 5.5 at 5 am and 5.2 at 7 am.  Creat down to 1.93 this am. Pt was admitted. We are asked to see for renal failure.   Vitals:   12/22/23 2306 12/23/23 0253 12/23/23 0500 12/23/23 0802  BP: 114/66 121/63  122/71  Pulse: 81 86  73  Resp: 16 16  20   Temp: 98.4 F (36.9 C) 98.3 F (36.8 C)  97.8 F (36.6 C)  TempSrc: Oral Oral  Oral  SpO2: 100% 91%  94%  Weight:   117.7 kg     Exam: Gen alert, no distress, obese Sclera anicteric, throat clear  No jvd or bruits Chest clear bilat to bases RRR no MRG Abd soft ntnd no mass or ascites +bs Ext no LE or UE edema, no other edema Neuro is alert, Ox 3 , nf, deconditioned   Home bp meds: Hydrochlorothiazide 12.5 mg qd Lisinopril 5mg  every day Others: asa, wellbutrin  xl, flexeril , colace, gabapentin , robaxin , PPI, oxy IR, percocet, zantac, crestor , vit/ supps     Date                             Creat               eGFR (ml/min) 2016                            0.7- 0.9            > 60 ml/min 2017                            0.7                   > 60 ml/min 2021                            1.18                 75 ml/min 12/21/23                      2.33                  Admit date 12/13                           2.16, 1.93     33, 37 ml/min 12/14   1.87  39 ml/min  UA 12/13- protein neg, neg LE/ Hb Renal US : 9.1/ 10.6 cm kidneys, no hydro, normal echotexture CXR - not done     Assessment/ Plan: AKI: unclear baseline, last creatinine was 1.18 from 2021, eGFR 75 ml/min. Creat here was 2.3 on admit and was down to 1.9 at time of consult. Making urine. Pt was clearly vol depleted given initial albumin of 4.5. Was taking hydrochlorothiazide and ACEi at home. Renal US  showed no obstruction and UA was negative. Suspect AKI due to vol depletion + ACEi effects. Recommend avoid diuretics/ ACEi/ ARB for 2 mos minimum. Gave NS bolus 1.2 L and resumed IVF's at 100 cc/hr (bicarb gtt) yesterday. Creat 1.8 this am. Not sure if this is a new baseline vs AKI recovering vs other. Will watch another 24 hrs, cont IVF's, labs in am.  Hyperkalemia: from AKI + acei. K+ today down to 4.7. Cont renal diet w/o fluid restriction (low K diet).  H/o prostate cancer H/o HTN: see above, avoid acei/ ARB/ diuretics for 2 mos minimum       Rob Sherron Mapp MD  [facility] 12/23/2023, 8:10 AM  Recent Labs  Lab 12/22/23 0359 12/22/23 0544 12/22/23 0800 12/22/23 1824 12/23/23 0633  HGB 10.6*  --   --   --  9.2*  ALBUMIN  --  3.9  3.8  --  3.9  --   CALCIUM   --  10.0  10.0 9.6 10.0  --   PHOS  --  3.6  --   --   --   CREATININE  --  2.11*  2.03* 1.93* 2.19*  --   K  --  5.5*  5.5* 5.2* 6.0*  --    Recent Labs  Lab 12/22/23 1623  IRON 64  TIBC 328  FERRITIN 292   Inpatient medications:  buPROPion   300 mg Oral Daily   docusate sodium   100 mg Oral BID   famotidine   20 mg Oral Daily   ferrous sulfate   324 mg Oral Q breakfast   gabapentin   200 mg Oral TID   heparin   5,000 Units Subcutaneous Q8H   pantoprazole   40 mg Oral Daily   rosuvastatin   20 mg Oral Daily   [START ON 12/24/2023] Vitamin D  (Ergocalciferol )  50,000 Units Oral Weekly    sodium bicarbonate   150 mEq in sterile water  1,150 mL infusion 100 mL/hr at 12/22/23 1832   albuterol , cyclobenzaprine , ondansetron  **OR** ondansetron  (ZOFRAN ) IV, oxyCODONE -acetaminophen , traMADol 

## 2023-12-23 NOTE — Evaluation (Signed)
 Physical Therapy Evaluation Patient Details Name: Justin Cain. MRN: [id number] DOB: 11/03/1955 Today's Date: 12/23/2023  History of Present Illness  68 yr old male admitted to the hospital with hypotension, hyperkalemia, AKI. PMH: HLD, GERD, prostate CA, colon CA, spinal stenosis, DJD, chronic pain  Clinical Impression  On eval, pt was Min assist for mobility. He ambulated ~125 feet with his straight cane. Pt had intermittent unsteadiness and reported some mild dizziness. He tolerated activity well. Discussed d/c plan-he plans to d/c home. He politely declines any PT f/u. Will plan to follow pt during this hospital stay.         If plan is discharge home, recommend the following: A little help with walking and/or transfers;A little help with bathing/dressing/bathroom;Assistance with cooking/housework;Assist for transportation;Help with stairs or ramp for entrance   Can travel by private vehicle        Equipment Recommendations None recommended by PT  Recommendations for Other Services       Functional Status Assessment Patient has had a recent decline in their functional status and demonstrates the ability to make significant improvements in function in a reasonable and predictable amount of time.     Precautions / Restrictions Precautions Precautions: Fall Restrictions Weight Bearing Restrictions Per Provider Order: No      Mobility  Bed Mobility               General bed mobility comments: oob in recliner    Transfers Overall transfer level: Needs assistance Equipment used: Straight cane Transfers: Sit to/from Stand Sit to Stand: Min assist           General transfer comment: Increased time and effort-rocking/momentum technique utilized.    Ambulation/Gait Ambulation/Gait assistance: Min assist Gait Distance (Feet): 125 Feet Assistive device: Straight cane Gait Pattern/deviations: Step-through pattern, Decreased stride length       General Gait  Details: Pt reports mild dizziness. Intermittent unsteadiness requiring light assistance to correct. Pt, at times, using hallway handrail in addition to his cane.  Stairs            Wheelchair Mobility     Tilt Bed    Modified Rankin (Stroke Patients Only)       Balance Overall balance assessment: Mild deficits observed, not formally tested   Sitting balance-Leahy Scale: Good     Standing balance support: Single extremity supported, During functional activity Standing balance-Leahy Scale: Fair                               Pertinent Vitals/Pain Pain Assessment Pain Assessment: Faces Faces Pain Scale: Hurts little more Pain Location: bil hips Pain Descriptors / Indicators: Aching Pain Intervention(s): Limited activity within patient's tolerance, Monitored during session, Repositioned    Home Living Family/patient expects to be discharged to:: Private residence Living Arrangements: Alone   Type of Home: Apartment Home Access: Stairs to enter Entrance Stairs-Rails: None Entrance Stairs-Number of Steps: 3 Alternate Level Stairs-Number of Steps: bedroom and full bathroom upstairs Home Layout: Two level;1/2 bath on main level Home Equipment: Cane - single point;Shower Counsellor (2 wheels) Additional Comments: has a reacher and sock aid he uses at home    Prior Function Prior Level of Function : Independent/Modified Independent;Driving             Mobility Comments: uses cane for ambulation ADLs Comments: Modified independent ADLs, driving, and cooking. He takes spoonge baths. Retired emergency planning/management officer.  Extremity/Trunk Assessment   Upper Extremity Assessment Upper Extremity Assessment: Defer to OT evaluation    Lower Extremity Assessment Lower Extremity Assessment: Generalized weakness (chronic hip pain, bilateral)    Cervical / Trunk Assessment Cervical / Trunk Assessment: Normal  Communication   Communication Communication:  No apparent difficulties    Cognition Arousal: Alert Behavior During Therapy: WFL for tasks assessed/performed   PT - Cognitive impairments: No apparent impairments                         Following commands: Intact       Cueing Cueing Techniques: Verbal cues     General Comments      Exercises     Assessment/Plan    PT Assessment Patient needs continued PT services  PT Problem List Decreased balance;Decreased mobility;Pain       PT Treatment Interventions DME instruction;Gait training;Functional mobility training;Therapeutic activities;Therapeutic exercise;Patient/family education;Balance training;Stair training    PT Goals (Current goals can be found in the Care Plan section)  Acute Rehab PT Goals Patient Stated Goal: home soon PT Goal Formulation: With patient Time For Goal Achievement: 01/06/24 Potential to Achieve Goals: Good    Frequency Min 3X/week     Co-evaluation               AM-PAC PT 6 Clicks Mobility  Outcome Measure Help needed turning from your back to your side while in a flat bed without using bedrails?: A Little Help needed moving from lying on your back to sitting on the side of a flat bed without using bedrails?: A Little Help needed moving to and from a bed to a chair (including a wheelchair)?: A Little Help needed standing up from a chair using your arms (e.g., wheelchair or bedside chair)?: A Little Help needed to walk in hospital room?: A Little Help needed climbing 3-5 steps with a railing? : A Little 6 Click Score: 18    End of Session Equipment Utilized During Treatment: Gait belt Activity Tolerance: Patient tolerated treatment well Patient left: in chair;with call bell/phone within reach   PT Visit Diagnosis: Unsteadiness on feet (R26.81);Difficulty in walking, not elsewhere classified (R26.2);Muscle weakness (generalized) (M62.81)    Time: 8843-8791 PT Time Calculation (min) (ACUTE ONLY): 12  min   Charges:   PT Evaluation $PT Eval Low Complexity: 1 Low   PT General Charges $$ ACUTE PT VISIT: 1 Visit           Dannial SQUIBB, PT Acute Rehabilitation  Office: [phone]

## 2023-12-24 DIAGNOSIS — E875 Hyperkalemia: Secondary | ICD-10-CM | POA: Diagnosis not present

## 2023-12-24 DIAGNOSIS — N179 Acute kidney failure, unspecified: Secondary | ICD-10-CM | POA: Diagnosis not present

## 2023-12-24 LAB — CBC
HCT: 30.8 % — ABNORMAL LOW (ref 39.0–52.0)
Hemoglobin: 9.9 g/dL — ABNORMAL LOW (ref 13.0–17.0)
MCH: 28 pg (ref 26.0–34.0)
MCHC: 32.1 g/dL (ref 30.0–36.0)
MCV: 87.3 fL (ref 80.0–100.0)
Platelets: 182 K/uL (ref 150–400)
RBC: 3.53 MIL/uL — ABNORMAL LOW (ref 4.22–5.81)
RDW: 13.9 % (ref 11.5–15.5)
WBC: 8.3 K/uL (ref 4.0–10.5)
nRBC: 0 % (ref 0.0–0.2)

## 2023-12-24 LAB — RENAL FUNCTION PANEL
Albumin: 3.6 g/dL (ref 3.5–5.0)
Anion gap: 11 (ref 5–15)
BUN: 22 mg/dL (ref 8–23)
CO2: 26 mmol/L (ref 22–32)
Calcium: 9.1 mg/dL (ref 8.9–10.3)
Chloride: 100 mmol/L (ref 98–111)
Creatinine, Ser: 1.92 mg/dL — ABNORMAL HIGH (ref 0.61–1.24)
GFR, Estimated: 37 mL/min — ABNORMAL LOW (ref >60)
Glucose, Bld: 114 mg/dL — ABNORMAL HIGH (ref 70–99)
Phosphorus: 3.3 mg/dL (ref 2.5–4.6)
Potassium: 4.5 mmol/L (ref 3.5–5.1)
Sodium: 138 mmol/L (ref 135–145)

## 2023-12-24 LAB — MAGNESIUM: Magnesium: 1.6 mg/dL — ABNORMAL LOW (ref 1.7–2.4)

## 2023-12-24 MED ORDER — MAGNESIUM SULFATE 2 GM/50ML IV SOLN
2.0000 g | Freq: Once | INTRAVENOUS | Status: AC
  Administered 2023-12-24: 10:00:00 2 g via INTRAVENOUS
  Filled 2023-12-24: qty 50

## 2023-12-24 NOTE — Plan of Care (Signed)

## 2023-12-24 NOTE — Discharge Summary (Signed)
 Physician Discharge Summary  [name] [name]. FMW:[id number] DOB: 1955-09-13 DOA: 12/21/2023  PCP: Joshua Francisco, MD  Admit date: 12/21/2023 Discharge date: 12/24/2023  Admitted From: Home Disposition: Home Recommendations for Outpatient Follow-up:  Outpatient follow-up with PCP in 1 to 2 weeks Check blood pressure, CMP and CBC at follow-up Please follow up on the following pending results: None  Home Health: No need identified Equipment/Devices: No need identified  Discharge Condition: Stable CODE STATUS: Full code    Hospital course 68 year old M with PMH of DJD/bilateral hip pain, colon cancer, prostate cancer, spinal stenosis, hypertension, anemia, hyperlipidemia, GERD and obesity sent to ED from [facility] pain clinic due to hypotension.  Patient was at clinic for his chronic pain and pain medication.  On evaluation, noted to be hypotensive and sent to ED.   In ED, stable vitals.  Hgb 11.6. Cr 2.33 (was 1.18 in 2021).  K7.4 but improved to 6.3 on repeat.  EKG NSR without ST and T waves.  Started on IV fluids and hyperkalemia treatment, and admitted for further care.  Renal ultrasound without significant finding.  Nephrology consulted.   Patient received additional fluid boluses and continued on IV sodium bicarbonate .  Hyperkalemia resolved.  AKI improved with creatinine leveling off between 1.8 and 1.9 which might be his new baseline.  Cleared for discharge by nephrology for outpatient follow-up. Nephrology recommended avoiding diuretics/ ACEi/ ARB for 2 mos minimum.  Also advised to stop full dose aspirin and avoid NSAID. Patient has been advised to continue renal diet and minimize foods rich in potassium.    See individual problem list below for more.   Problems addressed during this hospitalization AKI on CKD-3B: His Cr was 1.18 in 2021.  He is on lisinopril and HCTZ.  Seems to take full dose aspirin as well. Renal US  without significant finding.  Creatinine leveled off  between 1.8-1.9 which is likely his new baseline. -Discontinue lisinopril, HCTZ and aspirin. Nephrology recommended avoiding diuretics/ ACEi/ ARB for 2 mos minimum.  -Advised to avoid NSAID and maintain good hydration. -Recheck CMP at follow-up   Hyperkalemia: Likely due to AKI, acidosis and lisinopril.  Resolved. -Discontinued lisinopril and HCTZ..  -Recheck at follow-up.   High anion gap metabolic acidosis: Resolved.   Hypertension: Has been normotensive off home lisinopril and HCTZ - Discontinued home lisinopril and HCTZ.   Chronic back pain/DJD/bilateral hip pain-followed at [facility] clinic for pain management. - Continue home medications. -Outpatient follow-up at pain clinic  Anemia of renal disease: H&H stable after initial drop from IV fluid.  No overt bleeding.  Denies melena or hematochezia.  -Check CBC at follow-up  Hyperlipidemia - Continue home Crestor .   Hypomagnesemia: Mild.  Replenished prior to discharge - Monitor replenish.  Hypogonadism: On testosterone supplementation   GERD - PPI   Mood disorder: Stable - Continue home meds    Morbid obesity Body mass index is 43.09 kg/m.           Consultations: Nephrology  Time spent 35  minutes  Vital signs Vitals:   12/24/23 0300 12/24/23 0535 12/24/23 0700 12/24/23 1213  BP:  114/69  125/67  Pulse:  81  81  Temp:  98.6 F (37 C)  98.4 F (36.9 C)  Resp: 18 18  15   Weight:   121.1 kg   SpO2:  97%  100%  TempSrc:         Discharge exam  GENERAL: No apparent distress.  Nontoxic. HEENT: MMM.  Vision and hearing grossly  intact.  NECK: Supple.  No apparent JVD.  RESP:  No IWOB.  Fair aeration bilaterally. CVS:  RRR. Heart sounds normal.  ABD/GI/GU: BS+. Abd soft.  Mild epigastric tenderness. MSK/EXT:  Moves extremities.  Stasis dermatitis. SKIN: Stasis dermatitis. NEURO: AA.  Oriented appropriately.  No apparent focal neuro deficit. PSYCH: Calm. Normal affect.   Discharge  Instructions Discharge Instructions     Discharge instructions   Complete by: As directed    It has been a pleasure taking care of you!  You were hospitalized due to low blood pressure, acute kidney injury and high potassium level.  We have stopped your lisinopril, hydrochlorothiazide and aspirin.  Note that the pain medications can also contribute to low blood pressure.  Please be cautious with pain medications.  Maintain good hydration.  Avoid diet high in potassium (see a separate instruction for more on this).  Avoid any over-the-counter pain medication other than plain Tylenol .  Follow-up with your primary care doctor in 1 to 2 weeks or sooner if needed.    Take care,   Increase activity slowly   Complete by: As directed       Allergies as of 12/24/2023       Reactions   Bee Venom    Nsaids    Penicillins    Has patient had a PCN reaction causing immediate rash, facial/tongue/throat swelling, SOB or lightheadedness with hypotension: No Has patient had a PCN reaction causing severe rash involving mucus membranes or skin necrosis: No Has patient had a PCN reaction that required hospitalization No Has patient had a PCN reaction occurring within the last 10 years: No If all of the above answers are NO, then may proceed with Cephalosporin use.        Medication List     STOP taking these medications    aspirin EC 325 MG tablet   hydrochlorothiazide 12.5 MG tablet Commonly known as: HYDRODIURIL   lisinopril 5 MG tablet Commonly known as: ZESTRIL   methocarbamol  500 MG tablet Commonly known as: ROBAXIN    oxycodone  5 MG capsule Commonly known as: OXY-IR   ProCel Powd       TAKE these medications    buPROPion  300 MG 24 hr tablet Commonly known as: WELLBUTRIN  XL Take 300 mg by mouth daily.   cyclobenzaprine  10 MG tablet Commonly known as: FLEXERIL  Take 10 mg by mouth 3 (three) times daily as needed for muscle spasms.   docusate sodium  100 MG  capsule Commonly known as: COLACE Take 100 mg by mouth 2 (two) times daily.   ferrous sulfate  324 MG Tbec Take 324 mg by mouth daily with breakfast.   gabapentin  800 MG tablet Commonly known as: NEURONTIN  Take 800 mg by mouth at bedtime.   omeprazole 20 MG capsule Commonly known as: PRILOSEC Take 20 mg by mouth every morning.   oxyCODONE -acetaminophen  5-325 MG tablet Commonly known as: PERCOCET/ROXICET Take 1 tablet by mouth 4 (four) times daily as needed.   ranitidine 150 MG tablet Commonly known as: ZANTAC Take 150 mg by mouth every morning.   rosuvastatin  20 MG tablet Commonly known as: CRESTOR  Take 20 mg by mouth daily.   Testosterone 1.62 % Gel Apply 1 Pump topically at bedtime. Both Shoulders at night   Vitamin D  (Ergocalciferol ) 1.25 MG (50000 UNIT) Caps capsule Commonly known as: DRISDOL  Take 50,000 Units by mouth once a week. Mondays         Procedures/Studies:   US  RENAL Result Date: 12/21/2023 EXAM: US  Retroperitoneum  Complete, Renal. 12/21/2023 11:03:32 PM TECHNIQUE: Real-time ultrasonography of the retroperitoneum renal was performed. COMPARISON: CT abdomen 03/15/2008 CLINICAL HISTORY: AKI (acute kidney injury) FINDINGS: LIMITATIONS: Very limited visualization of the kidneys due to body habitus and ribs shadowing. FINDINGS: RIGHT KIDNEY/URETER: Right kidney measures 9.1 x 4.2 x 4.1 cm. Normal cortical echogenicity. No hydronephrosis. No calculus. No mass. LEFT KIDNEY/URETER: Left kidney measures 10.6 x 5.0 x 4.3 cm. Normal cortical echogenicity. No hydronephrosis. No calculus. No mass. BLADDER: Unremarkable appearance of the bladder. IMPRESSION: 1. Grossly normal kidneys noting very limited exam. Electronically signed by: Norman Gatlin MD 12/21/2023 11:21 PM EST RP Workstation: [id number]       The results of significant diagnostics from this hospitalization (including imaging, microbiology, ancillary and laboratory) are listed below for reference.      Microbiology: No results found for this or any previous visit (from the past 240 hours).   Labs:  CBC: Recent Labs  Lab 12/21/23 1859 12/21/23 2230 12/22/23 0359 12/23/23 0633 12/24/23 0432  WBC 9.2  --  10.4 7.5 8.3  HGB 11.6* 12.6* 10.6* 9.2* 9.9*  HCT 38.7* 37.0* 35.5* 29.7* 30.8*  MCV 92.8  --  93.4 87.9 87.3  PLT 244  --  195 209 182   BMP &GFR Recent Labs  Lab 12/22/23 0544 12/22/23 0800 12/22/23 1824 12/23/23 0632 12/23/23 0633 12/24/23 0432  NA 138  137 140 135 138  --  138  K 5.5*  5.5* 5.2* 6.0* 4.7  --  4.5  CL 107  107 109 103 104  --  100  CO2 15*  18* 19* 18* 21*  --  26  GLUCOSE 124*  124* 78 109* 93  --  114*  BUN 36*  36* 34* 35* 29*  --  22  CREATININE 2.11*  2.03* 1.93* 2.19* 1.87*  --  1.92*  CALCIUM  10.0  10.0 9.6 10.0 9.2  --  9.1  MG  --   --   --   --  1.6* 1.6*  PHOS 3.6  --   --  4.0  --  3.3   CrCl cannot be calculated (Unknown ideal weight.). Liver & Pancreas: Recent Labs  Lab 12/21/23 2014 12/22/23 0544 12/22/23 1824 12/23/23 0632 12/24/23 0432  AST 31 33  --   --   --   ALT 19 18  --   --   --   ALKPHOS 61 55  --   --   --   BILITOT 0.5 0.4  --   --   --   PROT 8.1 6.8  --   --   --   ALBUMIN 4.5 3.9  3.8 3.9 3.4* 3.6   No results for input(s): LIPASE, AMYLASE in the last 168 hours. No results for input(s): AMMONIA in the last 168 hours. Diabetic: No results for input(s): HGBA1C in the last 72 hours. Recent Labs  Lab 12/22/23 0533  GLUCAP 111*   Cardiac Enzymes: Recent Labs  Lab 12/22/23 0324  CKTOTAL 238   No results for input(s): PROBNP in the last 8760 hours. Coagulation Profile: No results for input(s): INR, PROTIME in the last 168 hours. Thyroid Function Tests: No results for input(s): TSH, T4TOTAL, FREET4, T3FREE, THYROIDAB in the last 72 hours. Lipid Profile: No results for input(s): CHOL, HDL, LDLCALC, TRIG, CHOLHDL, LDLDIRECT in the last 72  hours. Anemia Panel: Recent Labs    12/22/23 1623  VITAMINB12 438  FOLATE 13.3  FERRITIN 292  TIBC 328  IRON 64  RETICCTPCT 1.0   Urine analysis:    Component Value Date/Time   COLORURINE YELLOW 12/22/2023 0331   APPEARANCEUR CLEAR 12/22/2023 0331   LABSPEC 1.013 12/22/2023 0331   PHURINE 6.0 12/22/2023 0331   GLUCOSEU NEGATIVE 12/22/2023 0331   GLUCOSEU NEGATIVE 11/22/2007 1013   HGBUR NEGATIVE 12/22/2023 0331   BILIRUBINUR NEGATIVE 12/22/2023 0331   KETONESUR NEGATIVE 12/22/2023 0331   PROTEINUR NEGATIVE 12/22/2023 0331   UROBILINOGEN 0.2 mg/dL 88/86/7990 8986   NITRITE NEGATIVE 12/22/2023 0331   LEUKOCYTESUR NEGATIVE 12/22/2023 0331   Sepsis Labs: Invalid input(s): PROCALCITONIN, LACTICIDVEN   SIGNED:  Demisha Nokes T Sheralee Qazi, MD  [facility] 12/24/2023, 1:27 PM

## 2023-12-24 NOTE — Progress Notes (Signed)
°   12/24/23 1102  TOC Brief Assessment  Insurance and Status Reviewed  Patient has primary care physician Yes  Home environment has been reviewed Apartment  Prior level of function: Independent with ADL's  Prior/Current Home Services No current home services  Social Drivers of Health Review SDOH reviewed no interventions necessary  Readmission risk has been reviewed Yes  Transition of care needs no transition of care needs at this time

## 2023-12-24 NOTE — Progress Notes (Signed)
 [facility] Progress Note  Subjective:  Creatinine 1.9 today Excellent UOP yesterday w/ 2.1 L  No c/o's today  Presentation summary: 68 y.o. year-old w/ PMH of prostate cancer, colon cancer, morbid obesity, HL, HTN, anemia, spinal stenosis lumbar region who presented to ED last night after having hypotension at [facility]. Hx of syncope, also takes narcotic pain meds and muscle relaxants. In ED BP was 126/78, HR 88, RR 16, temp 98.7, RA 100% sats. Labs showed K+ 7.4, Co2 21, BUN 40, creat 2.33, wbc 9K, Hb 11.6, alb 4.5, LFT's ok. Pt was rx'd w/ temporizing measures twice for high K+, also got lokelma  10gm at 9pmp last night and 10gm at 4 am today. IVFs were given for vol depletion w/ sod bicarb at 75 cc/hr. Repeat K+ overnight showed 6.5 at 3 am today, 5.5 at 5 am and 5.2 at 7 am.  Creat down to 1.93 this am. Pt was admitted. We are asked to see for renal failure.   Vitals:   12/24/23 0000 12/24/23 0300 12/24/23 0535 12/24/23 0700  BP:   114/69   Pulse:   81   Resp: 18 18 18    Temp:   98.6 F (37 C)   TempSrc:      SpO2:   97%   Weight:    121.1 kg    Exam: Gen alert, no distress, obese Sclera anicteric, throat clear  No jvd or bruits Chest clear bilat to bases RRR no MRG Abd soft ntnd no mass or ascites +bs Ext no LE or UE edema, no other edema Neuro is alert, Ox 3 , nf, deconditioned   Home bp meds: Hydrochlorothiazide 12.5 mg qd Lisinopril 5mg  every day Others: asa, wellbutrin  xl, flexeril , colace, gabapentin , robaxin , PPI, oxy IR, percocet, zantac, crestor , vit/ supps     Date                             Creat               eGFR (ml/min) 2016                            0.7- 0.9            > 60 ml/min 2017                            0.7                   > 60 ml/min 2021                            1.18                 75 ml/min 12/21/23                      2.33                 Admit date 12/13                           2.16, 1.93     33, 37  ml/min 12/14   1.87  39 ml/min            UA 12/13- protein  neg, neg LE/ Hb Renal US : 9.1/ 10.6 cm kidneys, no hydro, normal echotexture CXR - not done     Assessment/ Plan: AKI: unclear baseline, last creatinine was 1.18 from 2021, eGFR 75 ml/min. Creat here was 2.3 on admit and was down to 1.9 at time of consult. Making urine. Pt was clearly vol depleted given initial albumin of 4.5. Was taking hydrochlorothiazide and ACEi at home. Renal US  showed no obstruction and UA was negative. Suspect AKI due to vol depletion + ACEi effects. Recommend avoid diuretics/ ACEi/ ARB for 2 mos minimum. Gave NS bolus 1.2 L and resumed IVF's at 100 cc/hr. Creat improved to 1.8 yest and today is stable at 1.9. Suspect this is a new baseline, vs stable ATN (less likely). Good UOP so ok for discharge. We will schedule a f/u visit at [facility] in 3-4 wks.  Hyperkalemia: K+ 7.4 on admission. Resolved down to 4.5 today.  H/o prostate cancer H/o HTN: would avoid acei/ ARB for 2 months, use other agents for BP control as needed until seen by renal team f/u. Hold hydrochlorothiazide at least 1 month.        Myer Fret MD  [facility] 12/24/2023, 11:46 AM  Recent Labs  Lab 12/23/23 [phone] 12/23/23 0633 12/24/23 0432  HGB  --  9.2* 9.9*  ALBUMIN 3.4*  --  3.6  CALCIUM  9.2  --  9.1  PHOS 4.0  --  3.3  CREATININE 1.87*  --  1.92*  K 4.7  --  4.5   Recent Labs  Lab 12/22/23 1623  IRON 64  TIBC 328  FERRITIN 292   Inpatient medications:  buPROPion   300 mg Oral Daily   docusate sodium   100 mg Oral BID   famotidine   20 mg Oral Daily   ferrous sulfate   324 mg Oral Q breakfast   gabapentin   200 mg Oral TID   heparin   5,000 Units Subcutaneous Q8H   pantoprazole   40 mg Oral Daily   rosuvastatin   20 mg Oral Daily   Vitamin D  (Ergocalciferol )  50,000 Units Oral Weekly    sodium bicarbonate  150 mEq in sterile water  1,150 mL infusion 100 mL/hr at 12/24/23 0027   albuterol , cyclobenzaprine , ondansetron  **OR**  ondansetron  (ZOFRAN ) IV, oxyCODONE -acetaminophen , traMADol
